# Patient Record
Sex: Male | Born: 1935 | Race: Black or African American | Hispanic: No | Smoking: Never smoker
Health system: Southern US, Community
[De-identification: ages and names within clinical notes are randomized; demographics above are authoritative.]

## PROBLEM LIST (undated history)

## (undated) DIAGNOSIS — F0390 Unspecified dementia without behavioral disturbance: Secondary | ICD-10-CM

## (undated) DIAGNOSIS — C229 Malignant neoplasm of liver, not specified as primary or secondary: Secondary | ICD-10-CM

## (undated) DIAGNOSIS — C801 Malignant (primary) neoplasm, unspecified: Secondary | ICD-10-CM

## (undated) DIAGNOSIS — B1920 Unspecified viral hepatitis C without hepatic coma: Secondary | ICD-10-CM

## (undated) HISTORY — PX: HIP SURGERY: SHX245

---

## 1999-06-30 ENCOUNTER — Ambulatory Visit (HOSPITAL_COMMUNITY): Admission: RE | Admit: 1999-06-30 | Discharge: 1999-06-30 | Payer: Self-pay | Admitting: Gastroenterology

## 1999-06-30 ENCOUNTER — Encounter (INDEPENDENT_AMBULATORY_CARE_PROVIDER_SITE_OTHER): Payer: Self-pay | Admitting: Specialist

## 2009-03-05 ENCOUNTER — Encounter: Admission: RE | Admit: 2009-03-05 | Discharge: 2009-03-05 | Payer: Self-pay | Admitting: Gastroenterology

## 2009-09-06 ENCOUNTER — Encounter: Admission: RE | Admit: 2009-09-06 | Discharge: 2009-09-06 | Payer: Self-pay | Admitting: Gastroenterology

## 2009-09-13 ENCOUNTER — Ambulatory Visit: Payer: Self-pay | Admitting: Hematology & Oncology

## 2009-10-07 LAB — CBC WITH DIFFERENTIAL (CANCER CENTER ONLY)
BASO#: 0 10*3/uL (ref 0.0–0.2)
Eosinophils Absolute: 0.3 10*3/uL (ref 0.0–0.5)
HCT: 38.7 % (ref 38.7–49.9)
LYMPH%: 49.1 % — ABNORMAL HIGH (ref 14.0–48.0)
MCH: 34.3 pg — ABNORMAL HIGH (ref 28.0–33.4)
MCV: 100 fL — ABNORMAL HIGH (ref 82–98)
MONO#: 0.4 10*3/uL (ref 0.1–0.9)
MONO%: 11.4 % (ref 0.0–13.0)
NEUT%: 31.2 % — ABNORMAL LOW (ref 40.0–80.0)
Platelets: 74 10*3/uL — ABNORMAL LOW (ref 145–400)
RBC: 3.86 10*6/uL — ABNORMAL LOW (ref 4.20–5.70)
WBC: 3.4 10*3/uL — ABNORMAL LOW (ref 4.0–10.0)

## 2009-10-07 LAB — PROTIME-INR (CHCC SATELLITE)

## 2009-10-09 LAB — COMPREHENSIVE METABOLIC PANEL
ALT: 61 U/L — ABNORMAL HIGH (ref 0–53)
Albumin: 3.3 g/dL — ABNORMAL LOW (ref 3.5–5.2)
CO2: 26 mEq/L (ref 19–32)
Glucose, Bld: 107 mg/dL — ABNORMAL HIGH (ref 70–99)
Potassium: 4.3 mEq/L (ref 3.5–5.3)
Sodium: 141 mEq/L (ref 135–145)
Total Protein: 6.8 g/dL (ref 6.0–8.3)

## 2009-10-09 LAB — LACTATE DEHYDROGENASE: LDH: 132 U/L (ref 94–250)

## 2009-10-09 LAB — AFP TUMOR MARKER: AFP-Tumor Marker: 799.7 ng/mL — ABNORMAL HIGH (ref 0.0–8.0)

## 2009-11-05 ENCOUNTER — Ambulatory Visit: Payer: Self-pay | Admitting: Hematology & Oncology

## 2009-11-06 LAB — COMPREHENSIVE METABOLIC PANEL
Alkaline Phosphatase: 91 U/L (ref 39–117)
BUN: 14 mg/dL (ref 6–23)
Creatinine, Ser: 1.12 mg/dL (ref 0.40–1.50)
Glucose, Bld: 82 mg/dL (ref 70–99)
Sodium: 139 mEq/L (ref 135–145)
Total Bilirubin: 1.5 mg/dL — ABNORMAL HIGH (ref 0.3–1.2)

## 2009-11-06 LAB — CBC WITH DIFFERENTIAL (CANCER CENTER ONLY)
BASO#: 0 10*3/uL (ref 0.0–0.2)
Eosinophils Absolute: 0.3 10*3/uL (ref 0.0–0.5)
HGB: 13.5 g/dL (ref 13.0–17.1)
MCH: 34.3 pg — ABNORMAL HIGH (ref 28.0–33.4)
MCV: 98 fL (ref 82–98)
MONO#: 0.4 10*3/uL (ref 0.1–0.9)
MONO%: 11.1 % (ref 0.0–13.0)
NEUT#: 1.2 10*3/uL — ABNORMAL LOW (ref 1.5–6.5)
Platelets: 84 10*3/uL — ABNORMAL LOW (ref 145–400)
RBC: 3.92 10*6/uL — ABNORMAL LOW (ref 4.20–5.70)
WBC: 3.7 10*3/uL — ABNORMAL LOW (ref 4.0–10.0)

## 2009-11-06 LAB — AFP TUMOR MARKER: AFP-Tumor Marker: 773.8 ng/mL — ABNORMAL HIGH (ref 0.0–8.0)

## 2010-03-07 ENCOUNTER — Ambulatory Visit: Payer: Self-pay | Admitting: Hematology & Oncology

## 2015-01-27 ENCOUNTER — Emergency Department (HOSPITAL_BASED_OUTPATIENT_CLINIC_OR_DEPARTMENT_OTHER): Payer: Medicare Other

## 2015-01-27 ENCOUNTER — Emergency Department (HOSPITAL_BASED_OUTPATIENT_CLINIC_OR_DEPARTMENT_OTHER)
Admission: EM | Admit: 2015-01-27 | Discharge: 2015-01-27 | Disposition: A | Discharge status: Home / Self Care | Payer: Medicare Other | Attending: Emergency Medicine | Admitting: Emergency Medicine

## 2015-01-27 ENCOUNTER — Encounter (HOSPITAL_BASED_OUTPATIENT_CLINIC_OR_DEPARTMENT_OTHER): Payer: Self-pay | Admitting: Emergency Medicine

## 2015-01-27 DIAGNOSIS — Y9389 Activity, other specified: Secondary | ICD-10-CM | POA: Diagnosis not present

## 2015-01-27 DIAGNOSIS — S8992XA Unspecified injury of left lower leg, initial encounter: Secondary | ICD-10-CM | POA: Insufficient documentation

## 2015-01-27 DIAGNOSIS — Y92129 Unspecified place in nursing home as the place of occurrence of the external cause: Secondary | ICD-10-CM | POA: Diagnosis not present

## 2015-01-27 DIAGNOSIS — Z79899 Other long term (current) drug therapy: Secondary | ICD-10-CM | POA: Insufficient documentation

## 2015-01-27 DIAGNOSIS — S3992XA Unspecified injury of lower back, initial encounter: Secondary | ICD-10-CM | POA: Insufficient documentation

## 2015-01-27 DIAGNOSIS — Y998 Other external cause status: Secondary | ICD-10-CM | POA: Diagnosis not present

## 2015-01-27 DIAGNOSIS — M25552 Pain in left hip: Secondary | ICD-10-CM

## 2015-01-27 DIAGNOSIS — Z7982 Long term (current) use of aspirin: Secondary | ICD-10-CM | POA: Diagnosis not present

## 2015-01-27 DIAGNOSIS — F0390 Unspecified dementia without behavioral disturbance: Secondary | ICD-10-CM | POA: Diagnosis not present

## 2015-01-27 DIAGNOSIS — W19XXXA Unspecified fall, initial encounter: Secondary | ICD-10-CM

## 2015-01-27 DIAGNOSIS — W1839XA Other fall on same level, initial encounter: Secondary | ICD-10-CM | POA: Diagnosis not present

## 2015-01-27 DIAGNOSIS — Z96642 Presence of left artificial hip joint: Secondary | ICD-10-CM | POA: Insufficient documentation

## 2015-01-27 DIAGNOSIS — S79912A Unspecified injury of left hip, initial encounter: Secondary | ICD-10-CM | POA: Diagnosis present

## 2015-01-27 DIAGNOSIS — M79605 Pain in left leg: Secondary | ICD-10-CM

## 2015-01-27 HISTORY — DX: Unspecified dementia, unspecified severity, without behavioral disturbance, psychotic disturbance, mood disturbance, and anxiety: F03.90

## 2015-01-27 MED ORDER — OXYCODONE-ACETAMINOPHEN 5-325 MG PO TABS
1.0000 | ORAL_TABLET | Freq: Once | ORAL | Status: DC
  Filled 2015-01-27: qty 1

## 2015-01-27 NOTE — ED Notes (Signed)
CBG 151. 

## 2015-01-27 NOTE — ED Provider Notes (Signed)
CSN: [id number]     Arrival date & time 01/27/15  1637 History  This chart was scribed for Wandra Arthurs, MD by Tula Nakayama, ED Scribe. This patient was seen in room MH10/MH10 and the patient's care was started at 4:51 PM.    Chief Complaint  Patient presents with  . Hip Pain  . Fall   The history is provided by the patient and a relative. No language interpreter was used.    HPI Comments: Jeffery Rhodes is a 81 y.o. male and resident of the [facility], with a history of dementia and left hip replacement, who presents to the Emergency Department complaining of constant hip pain that started after an unwitnessed fall 2 days ago. He states lower back pain as an associated symptom. Pt was found on his back in his room by staff around 8:30 pm on 2/5. He told his daughter that he got out of his bed to adjust the temperature and slipped on his socks. Pt hit his head, but did not suffer LOC. Pt has history of left hip replacement in December. He is currently in rehabilitation and does not walk without assistance. Pt's daughter denies taking anticoagulants.   Past Medical History  Diagnosis Date  . Dementia    Past Surgical History  Procedure Laterality Date  . Hip surgery Left    History reviewed. No pertinent family history. History  Substance Use Topics  . Smoking status: Never Smoker   . Smokeless tobacco: Not on file  . Alcohol Use: No    Review of Systems  Musculoskeletal: Positive for back pain and arthralgias.  Skin: Negative for wound.  Neurological: Negative for headaches.  All other systems reviewed and are negative.  Allergies  Review of patient's allergies indicates no known allergies.  Home Medications   Prior to Admission medications   Medication Sig Start Date End Date Taking? Authorizing Provider  amLODipine (NORVASC) 5 MG tablet Take 5 mg by mouth daily.   Yes Historical Provider, MD  aspirin 81 MG tablet Take 81 mg by mouth daily.   Yes Historical  Provider, MD  bisacodyl (DULCOLAX) 10 MG suppository Place 10 mg rectally daily as needed for moderate constipation.   Yes Historical Provider, MD  citalopram (CELEXA) 20 MG tablet Take 20 mg by mouth daily.   Yes Historical Provider, MD  docusate sodium (COLACE) 100 MG capsule Take 100 mg by mouth 2 (two) times daily.   Yes Historical Provider, MD  donepezil (ARICEPT) 10 MG tablet Take 10 mg by mouth at bedtime.   Yes Historical Provider, MD  furosemide (LASIX) 20 MG tablet Take 20 mg by mouth.   Yes Historical Provider, MD  magnesium hydroxide (MILK OF MAGNESIA) 400 MG/5ML suspension Take 30 mLs by mouth daily as needed for mild constipation.   Yes Historical Provider, MD  ondansetron (ZOFRAN) 4 MG tablet Take 4 mg by mouth every 8 (eight) hours as needed for nausea or vomiting.   Yes Historical Provider, MD  oxycodone (OXY-IR) 5 MG capsule Take 5 mg by mouth every 4 (four) hours as needed.   Yes Historical Provider, MD   BP 125/65 mmHg  Pulse 72  Temp(Src) 97.6 F (36.4 C) (Oral)  Resp 18  Ht 5' 9.5" (1.765 m)  Wt 140 lb (63.504 kg)  BMI 20.39 kg/m2  SpO2 100% Physical Exam  Constitutional: He appears well-developed and well-nourished. No distress.  HENT:  Head: Normocephalic and atraumatic.  No scalp hematoma; mucous membranes moist  Eyes: Conjunctivae and EOM are normal.  Neck: Neck supple. No tracheal deviation present.  Cardiovascular: Normal rate, regular rhythm and normal heart sounds.   Pulmonary/Chest: Effort normal. No respiratory distress.  Musculoskeletal:  Decreased ROM of left hip; mild tenderness to left hip and left leg; lower lumbar tenderness  Skin: Skin is warm and dry.  Psychiatric: He has a normal mood and affect. His behavior is normal.  Nursing note and vitals reviewed.   ED Course  Procedures (including critical care time) DIAGNOSTIC STUDIES: Oxygen Saturation is 100% on RA, normal by my interpretation.    COORDINATION OF CARE: 4:58 PM Discussed  treatment plan with pt's daughter at bedside and she agreed to plan.  Labs Review Labs Reviewed - No data to display  Imaging Review Dg Lumbar Spine Complete  01/27/2015   CLINICAL DATA:  Initial encounter for Fall with left hip and low back pain. Left hip replacement in December.  EXAM: LUMBAR SPINE - COMPLETE 4+ VIEW  COMPARISON:  CT of 11/21/2014  FINDINGS: Five lumbar type vertebral bodies. Left proximal femoral fixation, incompletely imaged. Maintenance of vertebral body height. Mild straightening of expected lordosis. Degenerative disc disease at the L4-5 level is advanced. Aortic atherosclerosis. Facet arthropathy is most advanced at the lumbosacral junction.  IMPRESSION: Degenerative disc disease and spondylosis, without acute finding.  Nonspecific straightening of expected lordosis.   Electronically Signed   By: Abigail Miyamoto M.D.   On: 01/27/2015 17:58   Dg Tibia/fibula Left  01/27/2015   CLINICAL DATA:  Fall 3 days ago.  Leg pain  EXAM: LEFT TIBIA AND FIBULA - 2 VIEW  COMPARISON:  None.  FINDINGS: Negative for acute fracture.  No focal bony lesion.  Mild arterial calcification.  IMPRESSION: Negative for fracture.   Electronically Signed   By: Franchot Gallo M.D.   On: 01/27/2015 18:00   Dg Hip Unilat With Pelvis 2-3 Views Left  01/27/2015   CLINICAL DATA:  [place] 3 days ago.  Left hip surgery 2 months ago.  EXAM: LEFT HIP (WITH PELVIS) 2-3 VIEWS  COMPARISON:  None.  FINDINGS: Three pins across the left femoral neck into the femoral head in good position. Nondisplaced left femoral neck fracture in satisfactory position and unchanged from preop studies.  No acute fracture or complication.  IMPRESSION: Satisfactory pinning of left femoral neck fracture. No new fracture or complication.   Electronically Signed   By: Franchot Gallo M.D.   On: 01/27/2015 17:59   Dg Femur Min 2 Views Left  01/27/2015   CLINICAL DATA:  Left hip pain and low back pain secondary to a fall yesterday. Previous left hip  fracture.  EXAM: LEFT FEMUR 2 VIEWS  COMPARISON:  Radiographs dated 11/22/2014 and 11/21/2014  FINDINGS: There is no acute abnormality of the femur. The patient has undergone open reduction internal fixation of subcapital fracture of the proximal left femoral neck. The fracture is slightly impacted. There are 3 screws in place.  IMPRESSION: No acute abnormality. Old fracture (11/21/2014) of the left femoral neck.   Electronically Signed   By: Rozetta Nunnery M.D.   On: 01/27/2015 18:01     EKG Interpretation None      MDM   Final diagnoses:  Hip pain, acute, left  Leg pain, anterior, left    Jeffery Rhodes is a 81 y.o. male here with l leg pain s/p fall. He did have head injury 2 days ago but mental status at baseline and no signs of injury so  will not need CT head. He has diffuse L lower extremity tenderness with no obvious deformity. Xray of back and L lower extremity showed no acute fracture and the pin L hip is in right location. He is written for oxycodone at the facility. Can d/c back   I personally performed the services described in this documentation, which was scribed in my presence. The recorded information has been reviewed and is accurate.    Wandra Arthurs, MD 01/27/15 [phone]

## 2015-01-27 NOTE — Discharge Instructions (Signed)
You have no fractures of your hip.   Continue rehab.   Take oxycodone as prescribed for pain.   Follow up with your doctor.   Return to ER if he has severe pain, vomiting, headaches.

## 2015-01-27 NOTE — ED Notes (Signed)
Pt had hip replacement in December.  Had a fall on Friday, does not complain of any pain.  Pt's granddaughter insisted that he come to be seen for hip evaluation.  Pt h/o dementia.

## 2015-01-27 NOTE — ED Notes (Signed)
PTAR here. Pt al;ert, NAD, calm, interactive, resps e/u, family at [facility]. Denies needs or questions. D/c'd by previous Therapist, sports. Family with paperwork.

## 2015-06-12 ENCOUNTER — Encounter (HOSPITAL_BASED_OUTPATIENT_CLINIC_OR_DEPARTMENT_OTHER): Payer: Self-pay | Admitting: *Deleted

## 2015-06-12 ENCOUNTER — Observation Stay (HOSPITAL_BASED_OUTPATIENT_CLINIC_OR_DEPARTMENT_OTHER)
Admission: EM | Admit: 2015-06-12 | Discharge: 2015-06-13 | Disposition: A | Discharge status: Skilled Nursing Facility | Payer: Medicare Other | Attending: Internal Medicine | Admitting: Internal Medicine

## 2015-06-12 ENCOUNTER — Emergency Department (HOSPITAL_BASED_OUTPATIENT_CLINIC_OR_DEPARTMENT_OTHER): Payer: Medicare Other

## 2015-06-12 DIAGNOSIS — R5383 Other fatigue: Secondary | ICD-10-CM | POA: Diagnosis not present

## 2015-06-12 DIAGNOSIS — I10 Essential (primary) hypertension: Secondary | ICD-10-CM | POA: Diagnosis not present

## 2015-06-12 DIAGNOSIS — B1920 Unspecified viral hepatitis C without hepatic coma: Secondary | ICD-10-CM | POA: Diagnosis not present

## 2015-06-12 DIAGNOSIS — F0390 Unspecified dementia without behavioral disturbance: Secondary | ICD-10-CM | POA: Diagnosis not present

## 2015-06-12 DIAGNOSIS — R531 Weakness: Secondary | ICD-10-CM | POA: Diagnosis not present

## 2015-06-12 DIAGNOSIS — R627 Adult failure to thrive: Principal | ICD-10-CM | POA: Diagnosis present

## 2015-06-12 DIAGNOSIS — E119 Type 2 diabetes mellitus without complications: Secondary | ICD-10-CM | POA: Insufficient documentation

## 2015-06-12 DIAGNOSIS — M1389 Other specified arthritis, multiple sites: Secondary | ICD-10-CM | POA: Insufficient documentation

## 2015-06-12 DIAGNOSIS — M79601 Pain in right arm: Secondary | ICD-10-CM | POA: Diagnosis not present

## 2015-06-12 DIAGNOSIS — Z515 Encounter for palliative care: Secondary | ICD-10-CM | POA: Diagnosis not present

## 2015-06-12 DIAGNOSIS — C220 Liver cell carcinoma: Secondary | ICD-10-CM | POA: Diagnosis present

## 2015-06-12 DIAGNOSIS — Z7982 Long term (current) use of aspirin: Secondary | ICD-10-CM | POA: Diagnosis not present

## 2015-06-12 HISTORY — DX: Malignant (primary) neoplasm, unspecified: C80.1

## 2015-06-12 HISTORY — DX: Unspecified viral hepatitis C without hepatic coma: B19.20

## 2015-06-12 HISTORY — DX: Malignant neoplasm of liver, not specified as primary or secondary: C22.9

## 2015-06-12 LAB — COMPREHENSIVE METABOLIC PANEL
ALT: 49 U/L (ref 17–63)
ANION GAP: 8 (ref 5–15)
AST: 72 U/L — ABNORMAL HIGH (ref 15–41)
Albumin: 2.4 g/dL — ABNORMAL LOW (ref 3.5–5.0)
Alkaline Phosphatase: 58 U/L (ref 38–126)
BUN: 15 mg/dL (ref 6–20)
CALCIUM: 8.8 mg/dL — AB (ref 8.9–10.3)
CO2: 24 mmol/L (ref 22–32)
Chloride: 103 mmol/L (ref 101–111)
Creatinine, Ser: 0.69 mg/dL (ref 0.61–1.24)
GLUCOSE: 121 mg/dL — AB (ref 65–99)
Potassium: 3.9 mmol/L (ref 3.5–5.1)
Sodium: 135 mmol/L (ref 135–145)
Total Bilirubin: 1 mg/dL (ref 0.3–1.2)
Total Protein: 7.7 g/dL (ref 6.5–8.1)

## 2015-06-12 LAB — CBC
HEMATOCRIT: 36.3 % — AB (ref 39.0–52.0)
HEMOGLOBIN: 12.5 g/dL — AB (ref 13.0–17.0)
MCH: 32.6 pg (ref 26.0–34.0)
MCHC: 34.4 g/dL (ref 30.0–36.0)
MCV: 94.5 fL (ref 78.0–100.0)
Platelets: 317 10*3/uL (ref 150–400)
RBC: 3.84 MIL/uL — AB (ref 4.22–5.81)
RDW: 11.6 % (ref 11.5–15.5)
WBC: 9.4 10*3/uL (ref 4.0–10.5)

## 2015-06-12 LAB — URINALYSIS, ROUTINE W REFLEX MICROSCOPIC
BILIRUBIN URINE: NEGATIVE
Glucose, UA: NEGATIVE mg/dL
Hgb urine dipstick: NEGATIVE
Ketones, ur: 15 mg/dL — AB
Leukocytes, UA: NEGATIVE
NITRITE: NEGATIVE
PH: 7 (ref 5.0–8.0)
PROTEIN: NEGATIVE mg/dL
Specific Gravity, Urine: 1.018 (ref 1.005–1.030)
UROBILINOGEN UA: 4 mg/dL — AB (ref 0.0–1.0)

## 2015-06-12 LAB — I-STAT CG4 LACTIC ACID, ED: Lactic Acid, Venous: 1.01 mmol/L (ref 0.5–2.0)

## 2015-06-12 MED ORDER — ACETAMINOPHEN 650 MG RE SUPP: 650.0000 mg | Freq: Four times a day (QID) | RECTAL | Status: DC | PRN

## 2015-06-12 MED ORDER — ONDANSETRON HCL 4 MG PO TABS: 4.0000 mg | ORAL_TABLET | Freq: Four times a day (QID) | ORAL | Status: DC | PRN

## 2015-06-12 MED ORDER — SODIUM CHLORIDE 0.9 % IV SOLN
INTRAVENOUS | Status: DC
  Administered 2015-06-13: 06:00:00 via INTRAVENOUS

## 2015-06-12 MED ORDER — ENOXAPARIN SODIUM 40 MG/0.4ML ~~LOC~~ SOLN
40.0000 mg | SUBCUTANEOUS | Status: DC
  Administered 2015-06-13: 40 mg via SUBCUTANEOUS
  Filled 2015-06-12: qty 0.4

## 2015-06-12 MED ORDER — ONDANSETRON HCL 4 MG/2ML IJ SOLN: 4.0000 mg | Freq: Four times a day (QID) | INTRAMUSCULAR | Status: DC | PRN

## 2015-06-12 MED ORDER — ACETAMINOPHEN 325 MG PO TABS: 650.0000 mg | ORAL_TABLET | Freq: Four times a day (QID) | ORAL | Status: DC | PRN

## 2015-06-12 MED ORDER — SODIUM CHLORIDE 0.9 % IV SOLN
Freq: Once | INTRAVENOUS | Status: AC
  Administered 2015-06-12: 18:00:00 via INTRAVENOUS

## 2015-06-12 NOTE — ED Notes (Signed)
Pt has a stage 2 decub to sacral area, reddened, meplex applied. PT repositioned.

## 2015-06-12 NOTE — Progress Notes (Signed)
Pt accepted to [facility]. Pt daughter making arrangments to sign patient in to facility. Once paperwork has been completed pt can be transferred. CSW to call RN with time and arrangements.   Belia Heman, [place] Work  [employer] [phone]

## 2015-06-12 NOTE — ED Notes (Signed)
Pt's granddaughter at bedside is caregiver and sts she has [facility] POA. She sts that she is no longer able to care for the pt due to his body being stiff and him no longer being able to stand, so she had EMS bring him here so he could be placed into hospice care. Pt had appointment today with PCP for f/u. Pt able to tell me his name but not oriented otherwise. While adjusting pt's pillow, he appeared to be in pain but could not localize pain for me.

## 2015-06-12 NOTE — ED Notes (Signed)
Prior to in/out cath, complete perineal cleaning provided with allevyn gel dressing placed on sacral area due to possible skin breakdown. Brief applied.

## 2015-06-12 NOTE — ED Notes (Signed)
MD at bedside. 

## 2015-06-12 NOTE — ED Notes (Signed)
Spoke with Education officer, museum by phone who sts that she will contact pt's grand daughter and attempt to place home health for the time being. She will call me back with details/plan later.
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
## 2015-06-12 NOTE — ED Notes (Signed)
EMS reports called out for body aches and joint pain. Pt's care giver told EMS that he is in pain and that causes his body to be stiff and rigid and that she cannot take care of him when he is like that. EMS sts pt denied pain with them and is CAO at baseline and pleasant.

## 2015-06-12 NOTE — ED Notes (Signed)
Pt's grand daughter is leaving. Will call her with room number once room has been assigned.

## 2015-06-12 NOTE — ED Notes (Signed)
Updated [name], Social Work, that patients granddaughter, Jeffery Rhodes, would not answer or call back.

## 2015-06-12 NOTE — Progress Notes (Addendum)
[facility] called phone number listed for patient's POA Ms. Shirlee Limerick, patient's POA [phone] and spoke Lanell Persons mother.  EDCM left phone number with Twanetta's mother to call [facility] back ASAP.  EDSW gave [facility] another phone number to reach [facility] [phone].  EDCM called this phone number and left generic message with phone number to call back ASAP.    06/12/2015 A.Constance Whittle RNCM 1740pm Attempted to call patient's POA Twanetta again without success.  Left another message with phone number for call back.

## 2015-06-12 NOTE — ED Notes (Signed)
I spoke with Belia Heman, Social Worker with [facility], she emailed a placement form which I printed out and Dr. Mingo Amber signed. Same is placed on pt's chart. She will call back once placement has been arranged.

## 2015-06-12 NOTE — ED Notes (Signed)
Dr Tamera Punt concerned about disposition of patient - Called Social Worker @ [phone] Message left for [name].

## 2015-06-12 NOTE — ED Notes (Signed)
Spoke to [employer], [place] Work, who states that granddaughter is in the process of sending her the needed documentation to move forward with placement, so that they can arrange non emergent transport for patient. Agricultural consultant and EDP updated.

## 2015-06-12 NOTE — ED Notes (Signed)
Attempted to call granddaughter, Melina Fiddler @ [phone] and [phone] - to see if she had an update from Social Work or if she had completed needed documents for transfer - no answer on both lines - message left.
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
## 2015-06-12 NOTE — Progress Notes (Signed)
Demographics  Comment      Last edited by  on at    Address: Home Phone: Work Futures trader:   [address]  [place] [address]   [phone] -- [phone]   SSN: Insurance: Marital Status: Religion:   [id number] [place] [employer]    Patient Information    Patient Name Sex DOB   Gehrig, Patras ([id number]) Male April 14, 1929     Room Bed   [place]    PCS Documentation       Most Recent Value   Medical Necessity for Transport Certificate --- IF THIS TRANSPORT IS ROUND TRIP OR SCHEDULED AND REPEATED, A PHYSICIAN MUST COMPLETE THIS FORM    Transport to (Location)  -- [[facility]]   Reason for Transport  Discharge   Name of [facility]   Round Trip Transport?  No   Q1 Are ALL the following "true" for this patient?       1. Unable to get up from bed without assistance AND      2. Unable to ambulate AND    3. Unable to sit in a chair, including a wheelchair.  Yes   Q2 Could the patient be transported safely by other means of transportation (I.E., wheelchair van)?  No   Reason for transport - patient condition  Risk of injury to self and/or others   Q3 Reason based on Facility and/or Services  Transfer due to patient or family Physiological scientist (can be: Physician, RN, NP, PA, DP, CNS)  Belia Heman, [place] Phone: [phone]   Subscriber: [place], [place] Subscriber#: [id number]   Group#: [id number] Precert#:       MEDICAID [place]/MEDICAID OF [place] Phone: [phone]   Subscriber: Kru, [place] Subscriber#: [id number] N   Group#:  Precert#:

## 2015-06-12 NOTE — Progress Notes (Signed)
CSW received consult from EDP and consulted hosptialist regarding patient needing resources including possible placement, home health, or hospice referral. CSW attempted to reach pt granddaughter, twanetta Shirlee Limerick, who is primary care giver and HCPOA for patient per nurse. CSW called pt granddaughter at [phone] and left message.   Belia Heman, [place] Work  [employer] [phone]
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
## 2015-06-12 NOTE — ED Notes (Signed)
Assumed care of patient from [place], Therapist, sports, pt awaiting social worker Belia Heman) placement to SNF. Pt repositioned, oral care given, pericare provided, incontinence care given, pt repositioned on side with assist of pillow. VSS. Pt resting quietly. Sleeps but easily aroused with verbal stimuli - baseline confusion present. No s/s of pain noted. Will monitor.
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
## 2015-06-12 NOTE — ED Notes (Signed)
Left message with On call Social Worker @ [phone] - awaiting return phone call

## 2015-06-12 NOTE — ED Provider Notes (Signed)
PT was placed in a SNF, but granddaughter refused to sign permission.  Wants hospice consult.  When explained to her that we cannot place in hospice home today, granddaughter was told she needed to come pick up her grandfather, but refused.  SW and RN here have tried multiple times to reach granddaughter, but she will now not answer the phone.  Pt has been here at [facility] for 12 hours.  Spoke with Dr. Ree Kida, hospitalist at [facility] who has agreed to admit the pt for obs.  Malvin Johns, MD 06/12/15 [phone]

## 2015-06-12 NOTE — Discharge Instructions (Signed)

## 2015-06-12 NOTE — ED Notes (Signed)
Paged multiple social work pagers in an attempt to figure out disposition plan for patient:  Paged [phone]; [phone]; [phone]

## 2015-06-12 NOTE — ED Notes (Addendum)
Jeffery Rhodes, now states she wants Hospice involved in the patients care. Notified [name] with Social Work.

## 2015-06-13 ENCOUNTER — Observation Stay (HOSPITAL_COMMUNITY): Payer: Medicare Other

## 2015-06-13 DIAGNOSIS — Z515 Encounter for palliative care: Secondary | ICD-10-CM

## 2015-06-13 DIAGNOSIS — R627 Adult failure to thrive: Secondary | ICD-10-CM | POA: Diagnosis not present

## 2015-06-13 DIAGNOSIS — M79601 Pain in right arm: Secondary | ICD-10-CM | POA: Diagnosis present

## 2015-06-13 DIAGNOSIS — C220 Liver cell carcinoma: Secondary | ICD-10-CM | POA: Diagnosis present

## 2015-06-13 LAB — COMPREHENSIVE METABOLIC PANEL
ALT: 52 U/L (ref 17–63)
ANION GAP: 8 (ref 5–15)
AST: 88 U/L — ABNORMAL HIGH (ref 15–41)
Albumin: 2.2 g/dL — ABNORMAL LOW (ref 3.5–5.0)
Alkaline Phosphatase: 61 U/L (ref 38–126)
BILIRUBIN TOTAL: 0.6 mg/dL (ref 0.3–1.2)
BUN: 12 mg/dL (ref 6–20)
CHLORIDE: 106 mmol/L (ref 101–111)
CO2: 25 mmol/L (ref 22–32)
Calcium: 8.9 mg/dL (ref 8.9–10.3)
Creatinine, Ser: 0.6 mg/dL — ABNORMAL LOW (ref 0.61–1.24)
GFR calc non Af Amer: >60 mL/min (ref >60)
GLUCOSE: 83 mg/dL (ref 65–99)
POTASSIUM: 4 mmol/L (ref 3.5–5.1)
SODIUM: 139 mmol/L (ref 135–145)
Total Protein: 6.9 g/dL (ref 6.5–8.1)

## 2015-06-13 LAB — PROTIME-INR
INR: 1.19 (ref 0.00–1.49)
PROTHROMBIN TIME: 15.3 s — AB (ref 11.6–15.2)

## 2015-06-13 LAB — CBC
HCT: 37.1 % — ABNORMAL LOW (ref 39.0–52.0)
Hemoglobin: 12.6 g/dL — ABNORMAL LOW (ref 13.0–17.0)
MCH: 32.3 pg (ref 26.0–34.0)
MCHC: 34 g/dL (ref 30.0–36.0)
MCV: 95.1 fL (ref 78.0–100.0)
Platelets: 322 10*3/uL (ref 150–400)
RBC: 3.9 MIL/uL — ABNORMAL LOW (ref 4.22–5.81)
RDW: 12.6 % (ref 11.5–15.5)
WBC: 8 10*3/uL (ref 4.0–10.5)

## 2015-06-13 MED ORDER — ONDANSETRON HCL 4 MG PO TABS: 4.0000 mg | ORAL_TABLET | Freq: Four times a day (QID) | ORAL | Status: DC | PRN

## 2015-06-13 NOTE — Progress Notes (Signed)
SLP Cancellation Note  Patient Details Name: Jeffery Rhodes MRN: [id number] DOB: Aug 22, 1929   Cancelled treatment:       Reason Eval/Treat Not Completed: Patient at procedure or test/unavailable   Germain Osgood, M.A. CCC-SLP [phone]  Germain Osgood 06/13/2015, 9:19 AM
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
## 2015-06-13 NOTE — Discharge Planning (Addendum)
Pt's grandaughter called, at 0930  very upset regarding his care and discharge planning. Allowed to ventilate and then given Social work department number for her to f/u with. I had not seen the note from [place] at that time.  At 1100 Case manger states patient will transfer to [facility] today.
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
## 2015-06-13 NOTE — Evaluation (Signed)
Clinical/Bedside Swallow Evaluation Patient Details  Name: Jeffery Rhodes MRN: [id number] Date of Birth: 1929/04/02  Today's Date: 06/13/2015 Time: SLP Start Time (ACUTE ONLY): 1015 SLP Stop Time (ACUTE ONLY): 1028 SLP Time Calculation (min) (ACUTE ONLY): 13 min  Past Medical History:  Past Medical History  Diagnosis Date  . Dementia   . Cancer   . Liver cancer   . Hepatitis C    Past Surgical History:  Past Surgical History  Procedure Laterality Date  . Hip surgery Left    HPI:  CLAUDIA ALVIZO is a 81 y.o. male who presents with increasing fatigue/FTT. PMH: hepatocellular carcinoma, hepatitis C, dementia, hypertension.   Assessment / Plan / Recommendation Clinical Impression  Pt does not have overt signs of aspiration or dysphagia at this time. He shares that he does prefer softer foods due to missing upper dentition. Will initiate Dys 3 diet and thin liquids. No acute SLP needs identified at this time.    Aspiration Risk  Mild    Diet Recommendation Dysphagia 3 (Mech soft);Thin   Medication Administration: Whole meds with puree Compensations: Slow rate;Small sips/bites    Other  Recommendations Oral Care Recommendations: Oral care BID   Pertinent Vitals/Pain n/a    SLP Swallow Goals     Swallow Study Prior Functional Status       General Other Pertinent Information: [name] is a 81 y.o. male who presents with increasing fatigue/FTT. PMH: hepatocellular carcinoma, hepatitis C, dementia, hypertension. Type of Study: Bedside swallow evaluation Previous Swallow Assessment: none in chart Diet Prior to this Study: NPO Temperature Spikes Noted: No Respiratory Status: Room air History of Recent Intubation: No Behavior/Cognition: Alert;Cooperative;Pleasant mood;Requires cueing Oral Cavity - Dentition: Missing dentition;Other (Comment) (natural lower dentition, edentulous upper) Self-Feeding Abilities: Needs assist Patient Positioning: Upright in bed Baseline  Vocal Quality: Low vocal intensity    Oral/Motor/Sensory Function Overall Oral Motor/Sensory Function: Appears within functional limits for tasks assessed   Ice Chips Ice chips: Not tested   Thin Liquid Thin Liquid: Within functional limits Presentation: Self Fed;Straw    Nectar Thick Nectar Thick Liquid: Not tested   Honey Thick Honey Thick Liquid: Not tested   Puree Puree: Within functional limits Presentation: Spoon;Self Fed   Solid   GO Functional Assessment Tool Used: skilled clinical judgment Functional Limitations: Swallowing Swallow Current Status [phone]): At least 1 percent but less than 20 percent impaired, limited or restricted Swallow Goal Status [phone]): At least 1 percent but less than 20 percent impaired, limited or restricted Swallow Discharge Status [phone]): At least 1 percent but less than 20 percent impaired, limited or restricted  Solid: Within functional limits        Germain Osgood, M.A. CCC-SLP [phone]  Germain Osgood 06/13/2015,11:31 AM

## 2015-06-13 NOTE — Consult Note (Signed)
Consultation Note Date: 06/13/2015   Patient Name: Jeffery Rhodes  DOB: May 30, 1929  MRN: [id number]  Age / Sex: 81 y.o., male   PCP: Omer Jack, MD Referring Physician: Reyne Dumas, MD  Reason for Consultation: Establishing goals of care and Inpatient hospice referral  Palliative Care Assessment and Plan Summary of Established Goals of Care and Medical Treatment Preferences    Palliative Care Discussion Held Today:   Mr. Beever is sitting up in bed and working with SLP when I come in. He was doing very well with his swallowing and was very happy to have some water. He is a very pleasant gentleman and was able to tell me his name and that he is in the hospital. His speech is delayed but he does answer most questions appropriately. He tells me he has no pain but does occasionally have generalized pain - he cannot describe further. 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 Chaplain was also at bedside and provided reassurance and comfort. He could not identify anything he was specifically worried about. He appeared to be getting more tired and speech began to become slower and more delayed.   At this time he does not appear to be appropriate for hospice facility and would be better served at [facility] for his care. If he in the future gets Medicaid to supplement his Medicare he may possibly receive hospice at [facility]. Unable to reach HCPOA to further discuss: code status, goals, rehospitalization, disposition. I did speak with DSS worker Lucila Maine [phone] and she confirms open case for pursuing guardianship but this is not official and decision maker is now still Neysa Bonito.    Contacts/Participants in Discussion: Primary Decision Maker:  Granddaughter - Neysa Bonito is HCPOA (copy of form is scanned in EPIC) but did not answer call or call back as of yet  Goals of Care/Code Status/Advance Care Planning:   Code Status: Full - unable to assess   Symptom Management:   Gen Pain: Continue home dose oxy IR prn.   Malnutrition as evidenced by muscle wasting, albumin 2.2. Recommend nutritional eval and supplements.   Psycho-social/Spiritual:   Support System: Unable to fully assess - appears to be poor.   Desire for further Chaplaincy support: yes  Prognosis: Unable to determine  Discharge Planning:  SNF with palliative       Chief Complaint: Fatigue  History of Present Illness: 81 y.o. male with Past medical history of hepatocellular carcinoma, hepatitis C, dementia, hypertension. The patient was brought in to [facility] [place] earlier with complaints of fatigue and with request for placement by granddaughter, as the family was unable to take care of the patient. Social worker was consulted, and [facility] living home was arranged for skilled nursing facility, the family did not want the patient to go to that facility and requested the patient to receive care at the hospice facility (I do not believe he is a candidate for hospice facility currently.) Since the hospice was not available when this discussion was held, the granddaughter was requested to take the patient home after discharge as there was no acute inpatient care require as per ED physician at [facility], but family was not available to  receive the patient.   Primary Diagnoses  Present on Admission:  . Failure to thrive in adult . Hepatocellular carcinoma . Right arm pain  Palliative Review of Systems:   Denies pain   I have reviewed the medical record, interviewed the patient and family, and examined the patient. The following aspects are pertinent.  Past Medical History  Diagnosis Date  . Dementia   . Cancer   . Liver cancer   . Hepatitis C     History   Social History  . Marital Status: Divorced    Spouse Name: N/A  . Number of Children: N/A  . Years of Education: N/A   Social History Main Topics  . Smoking status: Never Smoker   . Smokeless tobacco: Not on file  . Alcohol Use: No  . Drug Use: Not on file  . Sexual Activity: Not on file   Other Topics Concern  . None   Social History Narrative   No family history on file. Scheduled Meds: . enoxaparin (LOVENOX) injection  40 mg Subcutaneous Q24H   Continuous Infusions: . sodium chloride 75 mL/hr at 06/13/15 0548   PRN Meds:.acetaminophen **OR** acetaminophen, ondansetron **OR** ondansetron (ZOFRAN) IV Medications Prior to Admission:  Prior to Admission medications   Medication Sig Start Date End Date Taking? Authorizing Provider  amLODipine (NORVASC) 5 MG tablet Take 5 mg by mouth daily.   Yes Historical Provider, MD  furosemide (LASIX) 20 MG tablet Take 20 mg by mouth.    Historical Provider, MD  oxycodone (OXY-IR) 5 MG capsule Take 5 mg by mouth every 4 (four) hours as needed.    Historical Provider, MD   No Known Allergies CBC:    Component Value Date/Time   WBC 8.0 06/13/2015 0323   WBC 3.7* 11/06/2009 1059   HGB 12.6* 06/13/2015 0323   HGB 13.5 11/06/2009 1059   HCT 37.1* 06/13/2015 0323   HCT 38.3* 11/06/2009 1059   PLT 322 06/13/2015 0323   PLT 84* 11/06/2009 1059   MCV 95.1 06/13/2015 0323   MCV 98 11/06/2009 1059   NEUTROABS 1.2* 11/06/2009 1059   LYMPHSABS 1.8 11/06/2009 1059   EOSABS 0.3 11/06/2009 1059   BASOSABS 0.0 11/06/2009 1059   Comprehensive Metabolic Panel:    Component Value Date/Time   NA 139 06/13/2015 0323   K 4.0 06/13/2015 0323   CL 106 06/13/2015 0323   CO2 25 06/13/2015 0323   BUN 12 06/13/2015 0323   CREATININE 0.60* 06/13/2015 0323   GLUCOSE 83 06/13/2015 0323   CALCIUM 8.9 06/13/2015 0323   AST 88* 06/13/2015 0323   ALT 52 06/13/2015 0323   ALKPHOS 61 06/13/2015 0323   BILITOT 0.6 06/13/2015 0323   PROT  6.9 06/13/2015 0323   ALBUMIN 2.2* 06/13/2015 0323    Physical Exam:  Vital Signs: BP 143/62 mmHg  Pulse 65  Temp(Src) 97.4 F (36.3 C) (Axillary)  Resp 18  Ht 5\' 9"  (1.753 m)  Wt 62.234 kg (137 lb 3.2 oz)  BMI 20.25 kg/m2  SpO2 100% SpO2: SpO2: 100 % O2 Device: O2 Device: Not Delivered O2 Flow Rate:   Intake/output summary:  Intake/Output Summary (Last 24 hours) at 06/13/15 1023 Last data filed at 06/13/15 0607  Gross per 24 hour  Intake      0 ml  Output    300 ml  Net   -300 ml   Baseline Weight: Weight: 68.04 kg (150 lb) Most recent weight: Weight: 62.234 kg (137 lb 3.2 oz)  Exam  Findings:   General: NAD, sitting up, fatigued appearing HEENT: Temporal muscle wasting CVS: RRR Resp: No labored breathing Abd: Soft, NT, ND Extrem: Warm, dry Neuro: Sleepy, oriented x 2           Palliative Performance Scale: 30 %                Additional Data Reviewed: Recent Labs     06/12/15  0820  06/13/15  0323  WBC  9.4  8.0  HGB  12.5*  12.6*  PLT  317  322  NA  135  139  BUN  15  12  CREATININE  0.69  0.60*     Time In: 1020 Time Out: 1115 Time Total: 53min  Greater than 50%  of this time was spent counseling and coordinating care related to the above assessment and plan.   Signed by:  Vinie Sill, NP Palliative Medicine Team Pager # [phone] (M-F 8a-5p) Team Phone # [phone] (Nights/Weekends)
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
## 2015-06-13 NOTE — Progress Notes (Signed)
Pt was referred by nurse for Palliative care.Chaplain joined palliative team for consultation. Pt is weak and spoke of being a believer. Chaplain told him he wants to hear  his story as he is in considerable distress. Chaplain prayed with Pt and will continue to follow.   06/13/15 1000  Clinical Encounter Type  Visited With Patient  Visit Type Spiritual support;Critical Care  Referral From Nurse  Spiritual Encounters  Spiritual Needs Prayer;Emotional  Stress Factors  Patient Stress Factors Major life changes

## 2015-06-13 NOTE — Clinical Social Work Note (Addendum)
CSW contacted APS worker Leone Haven [phone] to inform her that patient will possibly discharge to [facility] will be signing patient in.  CSW spoke with [facility] who agreed to fax paperwork over to social work office so it can be completed.  CSW notified physician that patient has a bed today at [facility] signed and awaiting discharge summary and orders.  CSW spoke to [place] worker and gave her the contact information for [facility] on [address]  Paperwork has been completed for SNF admission and faxed to facility.  Jones Broom. [place], MSW, [place] 06/13/2015 11:43 AM

## 2015-06-13 NOTE — Clinical Social Work Note (Addendum)
CSW spoke to APS worker Leone Haven [phone] to discuss status of guardianship case.  Jeani Hawking reported that the case is in the process of being petitioned, and a court date has not been determined yet.  CSW informed Clinical Social Librarian, academic Rife that [place] worker reported it most likely will not be ready until Monday the 27th of June and Leone Haven will notify CSW and/or Contractor when the court date has been determined.  DSS worker stated it is currently an open APS case.  Patient to be d/c'ed today to [facility], patient to be transported via ems RN to call report.  Jones Broom. [place], MSW, [place] 06/13/2015 2:27 PM

## 2015-06-14 NOTE — Clinical Social Work Note (Signed)
CSW received phone call from Jeffery Rhodes from [place] who informed me that her office number is [phone].  This phone number was given to [facility] admissions worker as well.  Jeffery Rhodes also asked CSW where patient was previous to [facility] [place] informed her that he was at [facility], CSW gave contact information for facility.  CSW informed APS worker that patient will be discharging to Jeffery Rhodes, Jeffery Rhodes asked Jeffery Rhodes if she will inform daughter about transfer and that [place] signed her in and she said yes she would contact them.    Evette Cristal, MSW, [place], [phone]

## 2015-06-14 NOTE — Discharge Planning (Signed)
Discharged per EMS at 1600 with the clothing in the room.

## 2015-06-14 NOTE — Clinical Social Work Note (Signed)
CSW attempted to contact APS worker Leone Haven at her office number [phone].  CSW left a message on her voice mail, awaiting call back.  Jones Broom. [place], MSW, [place] 06/14/2015 11:03 AM

## 2015-06-17 ENCOUNTER — Non-Acute Institutional Stay (SKILLED_NURSING_FACILITY): Payer: Medicare Other | Admitting: Adult Health

## 2015-06-17 DIAGNOSIS — R627 Adult failure to thrive: Secondary | ICD-10-CM

## 2015-06-17 DIAGNOSIS — C220 Liver cell carcinoma: Secondary | ICD-10-CM

## 2015-06-17 DIAGNOSIS — B182 Chronic viral hepatitis C: Secondary | ICD-10-CM | POA: Diagnosis not present

## 2015-06-17 DIAGNOSIS — F0390 Unspecified dementia without behavioral disturbance: Secondary | ICD-10-CM

## 2015-06-18 ENCOUNTER — Encounter: Payer: Self-pay | Admitting: Internal Medicine

## 2015-06-18 ENCOUNTER — Non-Acute Institutional Stay (SKILLED_NURSING_FACILITY): Payer: Medicare Other | Admitting: Internal Medicine

## 2015-06-18 ENCOUNTER — Encounter: Payer: Self-pay | Admitting: Adult Health

## 2015-06-18 DIAGNOSIS — F0390 Unspecified dementia without behavioral disturbance: Secondary | ICD-10-CM

## 2015-06-18 DIAGNOSIS — R531 Weakness: Secondary | ICD-10-CM

## 2015-06-18 DIAGNOSIS — B1920 Unspecified viral hepatitis C without hepatic coma: Secondary | ICD-10-CM | POA: Insufficient documentation

## 2015-06-18 DIAGNOSIS — C220 Liver cell carcinoma: Secondary | ICD-10-CM

## 2015-06-18 DIAGNOSIS — R627 Adult failure to thrive: Secondary | ICD-10-CM
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
## 2015-06-18 NOTE — Progress Notes (Signed)
Patient ID: Jeffery Rhodes, male   DOB: 07/22/1929, 81 y.o.   MRN: [id number]  [facility] [place]     No Known Allergies     Chief Complaint  Patient presents with  . Hospitalization Follow-up    HPI:  He has been hospitalized due to his failure to thrive from his hepatocellular cancer. His family is unable to meet his need at home. This does represent a long term placement for him. He is unable to fully participate in the hpi ros due to his dementia; but he states that he feels "down, down, down".   Past Medical History  Diagnosis Date  . Dementia   . Cancer   . Liver cancer   . Hepatitis C     Past Surgical History  Procedure Laterality Date  . Hip surgery Left     VITAL SIGNS BP 146/64 mmHg  Pulse 68  Ht 5\' 9"  (1.753 m)  Wt 137 lb 12.8 oz (62.506 kg)  BMI 20.34 kg/m2   Outpatient Encounter Prescriptions as of 06/17/2015  Medication Sig  . amLODipine (NORVASC) 5 MG tablet Take 5 mg by mouth daily.  . clopidogrel (PLAVIX) 75 MG tablet Take 75 mg by mouth daily.  Marland Kitchen donepezil (ARICEPT) 10 MG tablet Take 10 mg by mouth daily.  Marland Kitchen gabapentin (NEURONTIN) 300 MG capsule Take 300 mg by mouth daily.  . ondansetron (ZOFRAN) 4 MG tablet Take 1 tablet (4 mg total) by mouth every 6 (six) hours as needed for nausea.  . polyethylene glycol powder (GLYCOLAX/MIRALAX) powder Take 17 g by mouth.  . simvastatin (ZOCOR) 20 MG tablet Take 20 mg by mouth daily.      SIGNIFICANT DIAGNOSTIC EXAMS  06-12-15: chest x-ray: No active disease.  06-13-15: right elbow x-ray: No acute findings     06-13-15: right hand x-ray: No acute abnormalities.    LABS REVIEWED:   06-12-15: wbc 9.4; hgb 12.5; hct 36.3; mcv 94.5; plt 317; glucose 121; bun 15; creat 0.69; k+3.9; na++135; ast 72; albumin 2.4 06-13-15: wbc 8.0; hgb 12.6; hct 37.1; mcv 95.1; plt 322; glucose 83; bun 12; creat 0.6; k+4.0; na++139; ast 88; albumin 2.2      Review of Systems  Unable to perform ROS    Physical  Exam  Constitutional: No distress.  Frail   Neck: Neck supple. No JVD present.  Cardiovascular: Normal rate, regular rhythm and intact distal pulses.   Respiratory: Effort normal and breath sounds normal. No respiratory distress.  GI: Soft. Bowel sounds are normal. He exhibits no distension. There is no tenderness.  Musculoskeletal: He exhibits no edema.  Is able to move extremities   Neurological: He is alert.  Skin: Skin is warm and dry. He is not diaphoretic.     ASSESSMENT/ PLAN:  1. Hepatocellular carcinoma; hepaitis C: he is not on any treatment at this time.   2 dementia: is late stage; will continue aricept 10 mg daily at this time; will need to monitor this medication based upon any future weight loss.   3. FTT: his current weight is 137 lb 12 oz. His albumin is 2.2. He is not engaging in his surroundings at this time. He will need to be followed by palliative care.   4. Hypertension: will continue norvasc 5 mg daily is on plavix 75 mg daily   5. Dyslipidemia: will continue zocor 20 mg daily; will monitor  6. Constipation: will continue miralax daily   7. Chronic pain: will continue neurontin 300 mg daily  8. Depression: will begin zoloft 50 mg daily and will monitor his status.    Time spent with patient 50 minutes.     Ok Edwards NP [facility]  Contact [phone] Monday through Friday 8am- 5pm  After hours call [phone]

## 2015-07-19 ENCOUNTER — Non-Acute Institutional Stay (SKILLED_NURSING_FACILITY): Payer: Medicare Other | Admitting: Adult Health

## 2015-07-19 DIAGNOSIS — I10 Essential (primary) hypertension: Secondary | ICD-10-CM | POA: Diagnosis not present

## 2015-07-19 DIAGNOSIS — R627 Adult failure to thrive: Secondary | ICD-10-CM | POA: Diagnosis not present

## 2015-07-19 DIAGNOSIS — G8929 Other chronic pain: Secondary | ICD-10-CM

## 2015-07-19 DIAGNOSIS — K5900 Constipation, unspecified: Secondary | ICD-10-CM | POA: Diagnosis not present

## 2015-07-19 DIAGNOSIS — F0390 Unspecified dementia without behavioral disturbance: Secondary | ICD-10-CM

## 2015-07-19 DIAGNOSIS — K5909 Other constipation: Secondary | ICD-10-CM

## 2015-07-19 DIAGNOSIS — C220 Liver cell carcinoma: Secondary | ICD-10-CM | POA: Diagnosis not present

## 2015-07-22 ENCOUNTER — Other Ambulatory Visit: Payer: Self-pay | Admitting: *Deleted

## 2015-07-22 MED ORDER — TRAMADOL HCL 50 MG PO TABS: ORAL_TABLET | ORAL | Status: DC

## 2015-07-22 NOTE — Telephone Encounter (Signed)
[facility] 

## 2015-08-19 ENCOUNTER — Other Ambulatory Visit: Payer: Self-pay | Admitting: *Deleted

## 2015-08-19 MED ORDER — OXYCODONE HCL 5 MG PO TABS: ORAL_TABLET | ORAL | Status: DC

## 2015-08-19 NOTE — Telephone Encounter (Signed)
[facility] 

## 2015-08-26 ENCOUNTER — Encounter: Payer: Self-pay | Admitting: Adult Health

## 2015-08-26 ENCOUNTER — Non-Acute Institutional Stay (SKILLED_NURSING_FACILITY): Payer: Medicare Other | Admitting: Adult Health

## 2015-08-26 DIAGNOSIS — E118 Type 2 diabetes mellitus with unspecified complications: Secondary | ICD-10-CM

## 2015-08-26 DIAGNOSIS — I10 Essential (primary) hypertension: Secondary | ICD-10-CM

## 2015-08-26 DIAGNOSIS — R627 Adult failure to thrive: Secondary | ICD-10-CM | POA: Diagnosis not present

## 2015-08-26 DIAGNOSIS — C220 Liver cell carcinoma: Secondary | ICD-10-CM

## 2015-08-26 DIAGNOSIS — K5900 Constipation, unspecified: Secondary | ICD-10-CM | POA: Diagnosis not present

## 2015-08-26 DIAGNOSIS — G8929 Other chronic pain: Secondary | ICD-10-CM | POA: Diagnosis not present

## 2015-08-26 DIAGNOSIS — K5909 Other constipation: Secondary | ICD-10-CM | POA: Insufficient documentation

## 2015-08-26 DIAGNOSIS — F0390 Unspecified dementia without behavioral disturbance: Secondary | ICD-10-CM | POA: Diagnosis not present

## 2015-08-26 NOTE — Progress Notes (Signed)
Patient ID: Jeffery Rhodes, male   DOB: [birth year], 81 y.o.   MRN: [id number]    Facility: [facility]      No Known Allergies  Chief Complaint  Patient presents with  . Medical Management of Chronic Issues    HPI:  He is a long term resident of this facility being seen for the management of his chronic illnesses.  He is not able to fully participate in the hpi or ros. He tell me today that he is feeling good. There are no nursing concerns being voiced today.    Past Medical History  Diagnosis Date  . Dementia   . Cancer   . Liver cancer   . Hepatitis C     Past Surgical History  Procedure Laterality Date  . Hip surgery Left     VITAL SIGNS BP 130/70 mmHg  Pulse 74  Ht 5\' 7"  (1.702 m)  Wt 136 lb (61.689 kg)  BMI 21.30 kg/m2  Patient's Medications  New Prescriptions   No medications on file  Previous Medications   AMLODIPINE (NORVASC) 5 MG TABLET    Take 5 mg by mouth daily.   CLOPIDOGREL (PLAVIX) 75 MG TABLET    Take 75 mg by mouth daily.   DONEPEZIL (ARICEPT) 10 MG TABLET    Take 10 mg by mouth at bedtime.   GABAPENTIN (NEURONTIN) 300 MG CAPSULE    Take 300 mg by mouth daily.   ONDANSETRON (ZOFRAN) 4 MG TABLET    Take 1 tablet (4 mg total) by mouth every 6 (six) hours as needed for nausea.   OXYCODONE (ROXICODONE) 5 MG IMMEDIATE RELEASE TABLET    Take one tablet by mouth every 4 hours as needed for pain   POLYETHYLENE GLYCOL POWDER (GLYCOLAX/MIRALAX) POWDER    Take 17 g by mouth.   TRAMADOL (ULTRAM) 50 MG TABLET    Take one tablet by mouth every 6 hours as needed for pain. Max 300mg  tramadol/day  Modified Medications   No medications on file  Discontinued Medications   No medications on file     SIGNIFICANT DIAGNOSTIC EXAMS  06-12-15: chest x-ray: No active disease.  06-13-15: right elbow x-ray: No acute findings     06-13-15: right hand x-ray: No acute abnormalities.    LABS REVIEWED:   06-12-15: wbc 9.4; hgb 12.5; hct 36.3; mcv 94.5;  plt 317; glucose 121; bun 15; creat 0.69; k+3.9; na++135; ast 72; albumin 2.4;  06-13-15: wbc 8.0; hgb 12.6; hct 37.1; mcv 95.1; plt 322; glucose 83; bun 12; creat 0.6; k+4.0; na++139; ast 88; albumin 2.2        Review of Systems Unable to perform ROS: Dementia     Physical Exam Constitutional: No distress.  Thin   Eyes: Conjunctivae are normal.  Neck: Neck supple. No JVD present. No thyromegaly present.  Cardiovascular: Normal rate, regular rhythm and intact distal pulses.   Respiratory: Effort normal and breath sounds normal. No respiratory distress. He has no wheezes.  GI: Soft. Bowel sounds are normal. He exhibits no distension. There is no tenderness.  Musculoskeletal: He exhibits no edema.  Able to move all extremities   Lymphadenopathy:    He has no cervical adenopathy.  Neurological: He is alert.  Skin: Skin is warm and dry. He is not diaphoretic.  Psychiatric: He has a normal mood and affect.     ASSESSMENT/ PLAN:  1. Hepatocellular carcinoma; hepaitis C: he is not on any treatment at this time.   2 dementia:  is late stage; is on aricept 10 mg nightly ; his current weight is 136 pounds and stable; will not make changes will monitor    3. FTT: his current weight is 136 pounds; is stable. His albumin is 2.2. He is more engaging to those around him. Will continue supplements per facility protocol and will monitor  4. Hypertension: will continue norvasc 5 mg daily is on plavix 75 mg daily   5. Dyslipidemia: will is currently off medications;  Will monitor   6. Constipation: will continue miralax daily   7. Chronic pain: will continue neurontin 300 mg daily and will stop  ultram 50 mg every 6 hours  As needed as he has not used this medication  8. Depression: will continue zoloft 50 mg daily and will monitor his status.   9. Diabetes: his hgb a1c is 7.7; is presently not on medications; will not make changes at this time will monitor his status     Ok Edwards NP [facility]  Contact [phone] Monday through Friday 8am- 5pm  After hours call [phone]

## 2015-08-26 NOTE — Progress Notes (Signed)
Patient ID: Jeffery Rhodes, male   DOB: 1929/03/22, 81 y.o.   MRN: [id number]   Facility: [facility]      No Known Allergies  Chief Complaint  Patient presents with  . Medical Management of Chronic Issues    HPI:  He is a long term resident of this facility being seen for the management of his chronic illnesses.  Since starting his zoloft 50 mg daily; he has become more engaging with those around him. He is unable to fully participate in the hpi; but is telling me that he is having pain "all over".    Past Medical History  Diagnosis Date  . Dementia   . Cancer   . Liver cancer   . Hepatitis C     Past Surgical History  Procedure Laterality Date  . Hip surgery Left     VITAL SIGNS BP 122/70 mmHg  Pulse 80  Ht 5\' 7"  (1.702 m)  Wt 135 lb (61.236 kg)  BMI 21.14 kg/m2  Patient's Medications  New Prescriptions   No medications on file  Previous Medications   AMLODIPINE (NORVASC) 5 MG TABLET    Take 5 mg by mouth daily.   CLOPIDOGREL (PLAVIX) 75 MG TABLET    Take 75 mg by mouth daily.   GABAPENTIN (NEURONTIN) 300 MG CAPSULE    Take 300 mg by mouth daily.   ONDANSETRON (ZOFRAN) 4 MG TABLET    Take 1 tablet (4 mg total) by mouth every 6 (six) hours as needed for nausea.   OXYCODONE (ROXICODONE) 5 MG IMMEDIATE RELEASE TABLET    Take one tablet by mouth every 4 hours as needed for pain   POLYETHYLENE GLYCOL POWDER (GLYCOLAX/MIRALAX) POWDER    Take 17 g by mouth.   SIMVASTATIN (ZOCOR) 20 MG TABLET    Take 20 mg by mouth daily.  Modified Medications   No medications on file  Discontinued Medications     SIGNIFICANT DIAGNOSTIC EXAMS   06-12-15: chest x-ray: No active disease.  06-13-15: right elbow x-ray: No acute findings     06-13-15: right hand x-ray: No acute abnormalities.    LABS REVIEWED:   06-12-15: wbc 9.4; hgb 12.5; hct 36.3; mcv 94.5; plt 317; glucose 121; bun 15; creat 0.69; k+3.9; na++135; ast 72; albumin 2.4;  06-13-15: wbc 8.0; hgb 12.6;  hct 37.1; mcv 95.1; plt 322; glucose 83; bun 12; creat 0.6; k+4.0; na++139; ast 88; albumin 2.2       Review of Systems  Unable to perform ROS: Dementia      Physical Exam  Constitutional: No distress.  Thin   Eyes: Conjunctivae are normal.  Neck: Neck supple. No JVD present. No thyromegaly present.  Cardiovascular: Normal rate, regular rhythm and intact distal pulses.   Respiratory: Effort normal and breath sounds normal. No respiratory distress. He has no wheezes.  GI: Soft. Bowel sounds are normal. He exhibits no distension. There is no tenderness.  Musculoskeletal: He exhibits no edema.  Able to move all extremities   Lymphadenopathy:    He has no cervical adenopathy.  Neurological: He is alert.  Skin: Skin is warm and dry. He is not diaphoretic.  Psychiatric: He has a normal mood and affect.       ASSESSMENT/ PLAN:  1. Hepatocellular carcinoma; hepaitis C: he is not on any treatment at this time.   2 dementia: is late stage; he is currently not on medications; his current weight is 135 pounds; will not make changes will monitor  3. FTT: his current weight is 135 pounds. His albumin is 2.2. He is more engaging to those around him. Will continue supplements per facility protocol and will monitor  4. Hypertension: will continue norvasc 5 mg daily is on plavix 75 mg daily   5. Dyslipidemia: will stop the zocor his ldl is 75;  Will monitor   6. Constipation: will continue miralax daily   7. Chronic pain: will continue neurontin 300 mg daily and will begin ultram 50 mg every 6 hours   8. Depression: will continue zoloft 50 mg daily and will monitor his status.   9. Diabetes: his hgb a1c is 7.7; is presently not on medications; will not make changes at this time will monitor his status     Ok Edwards NP [facility]  Contact [phone] Monday through Friday 8am- 5pm  After hours call [phone]

## 2015-10-04 ENCOUNTER — Non-Acute Institutional Stay (SKILLED_NURSING_FACILITY): Payer: Medicare Other | Admitting: Adult Health

## 2015-10-04 DIAGNOSIS — F0390 Unspecified dementia without behavioral disturbance: Secondary | ICD-10-CM

## 2015-10-04 DIAGNOSIS — C220 Liver cell carcinoma: Secondary | ICD-10-CM | POA: Diagnosis not present

## 2015-10-04 DIAGNOSIS — I10 Essential (primary) hypertension: Secondary | ICD-10-CM | POA: Diagnosis not present

## 2015-10-04 DIAGNOSIS — K5900 Constipation, unspecified: Secondary | ICD-10-CM

## 2015-10-04 DIAGNOSIS — G8929 Other chronic pain: Secondary | ICD-10-CM

## 2015-10-04 DIAGNOSIS — B182 Chronic viral hepatitis C: Secondary | ICD-10-CM | POA: Diagnosis not present

## 2015-10-04 DIAGNOSIS — R627 Adult failure to thrive: Secondary | ICD-10-CM | POA: Diagnosis not present

## 2015-10-04 DIAGNOSIS — E118 Type 2 diabetes mellitus with unspecified complications: Secondary | ICD-10-CM

## 2015-10-04 DIAGNOSIS — K5909 Other constipation: Secondary | ICD-10-CM

## 2015-10-15 LAB — CBC AND DIFFERENTIAL
HEMATOCRIT: 38 % — AB (ref 41–53)
HEMOGLOBIN: 13.1 g/dL — AB (ref 13.5–17.5)
PLATELETS: 142 10*3/uL — AB (ref 150–399)
WBC: 8.2 10*3/mL

## 2015-10-15 LAB — BASIC METABOLIC PANEL
BUN: 16 mg/dL (ref 4–21)
Creatinine: 0.8 mg/dL (ref 0.6–1.3)
Glucose: 179 mg/dL
POTASSIUM: 4 mmol/L (ref 3.4–5.3)
Sodium: 134 mmol/L — AB (ref 137–147)

## 2015-10-15 LAB — HEPATIC FUNCTION PANEL
ALT: 54 U/L — AB (ref 10–40)
AST: 81 U/L — AB (ref 14–40)
Alkaline Phosphatase: 121 U/L (ref 25–125)
Bilirubin, Total: 0.7 mg/dL

## 2015-10-29 ENCOUNTER — Non-Acute Institutional Stay (SKILLED_NURSING_FACILITY): Payer: Medicare Other | Admitting: Internal Medicine

## 2015-10-29 DIAGNOSIS — C220 Liver cell carcinoma: Secondary | ICD-10-CM

## 2015-10-29 DIAGNOSIS — F0390 Unspecified dementia without behavioral disturbance: Secondary | ICD-10-CM | POA: Diagnosis not present

## 2015-10-29 DIAGNOSIS — G8929 Other chronic pain: Secondary | ICD-10-CM | POA: Diagnosis not present

## 2015-10-29 DIAGNOSIS — R627 Adult failure to thrive: Secondary | ICD-10-CM

## 2015-10-29 DIAGNOSIS — B182 Chronic viral hepatitis C: Secondary | ICD-10-CM

## 2015-10-29 DIAGNOSIS — E118 Type 2 diabetes mellitus with unspecified complications: Secondary | ICD-10-CM | POA: Diagnosis not present

## 2015-10-30 ENCOUNTER — Encounter: Payer: Self-pay | Admitting: Internal Medicine

## 2015-10-30 NOTE — Progress Notes (Signed)
Patient ID: Jeffery Rhodes, male   DOB: 03-24-1929, 81 y.o.   MRN: [id number]    DATE: 10/29/15  Location:  [place] of Service: SNF [phone])   Extended Emergency Contact Information Primary Emergency Contact: [place] Phone: [phone] Relation: Grandaughter  Advanced Directive information  FULL CODE  Chief Complaint  Patient presents with  . Medical Management of Chronic Issues    HPI:  81 yo male long term resident seen today for f/u. Nursing notes pt decline cognitively. He is total care. No falls. Appetite poor.  FTT - due to dementia and HCC. He takes nutritional supplements  HCC/hx Hep C - poor family support. Palliative care has seen pt. He takes roxicodone prn  Dementia - stable on aricept.   HTN - BP stable on amlodipine. He takes plavix. Lisinopril and lasix stopped at hospital in June  Hyperlipidemia - diet controlled  Constipation - stable on miralax. Takes zofran prn  Arm pain - stable on neurontin; w/u neg during the admission   DM - diet controlled  Past Medical History  Diagnosis Date  . Dementia   . Cancer ([place])   . Liver cancer ([place])   . Hepatitis C     Past Surgical History  Procedure Laterality Date  . Hip surgery Left     Patient Care Team: Omer Jack, MD as PCP - General (Internal Medicine)  Social History   Social History  . Marital Status: Divorced    Spouse Name: N/A  . Number of Children: N/A  . Years of Education: N/A   Occupational History  . Not on file.   Social History Main Topics  . Smoking status: Never Smoker   . Smokeless tobacco: Not on file  . Alcohol Use: No  . Drug Use: Not on file  . Sexual Activity: Not on file   Other Topics Concern  . Not on file   Social History Narrative     reports that he has never smoked. He does not have any smokeless tobacco history on file. He reports that he does not drink alcohol. His drug  history is not on file.   There is no immunization history on file for this patient.  No Known Allergies  Medications: Patient's Medications  New Prescriptions   No medications on file  Previous Medications   AMLODIPINE (NORVASC) 5 MG TABLET    Take 5 mg by mouth daily.   CLOPIDOGREL (PLAVIX) 75 MG TABLET    Take 75 mg by mouth daily.   DONEPEZIL (ARICEPT) 10 MG TABLET    Take 10 mg by mouth at bedtime.   GABAPENTIN (NEURONTIN) 300 MG CAPSULE    Take 300 mg by mouth daily.   ONDANSETRON (ZOFRAN) 4 MG TABLET    Take 1 tablet (4 mg total) by mouth every 6 (six) hours as needed for nausea.   OXYCODONE (ROXICODONE) 5 MG IMMEDIATE RELEASE TABLET    Take one tablet by mouth every 4 hours as needed for pain   POLYETHYLENE GLYCOL POWDER (GLYCOLAX/MIRALAX) POWDER    Take 17 g by mouth.   TRAMADOL (ULTRAM) 50 MG TABLET    Take one tablet by mouth every 6 hours as needed for pain. Max $RemoveB'300mg'[id number]$  tramadol/day  Modified Medications   No medications on file  Discontinued Medications   No medications on file    Review of Systems  Unable to perform ROS: Dementia    Filed  Vitals:   10/29/15 1138  BP: 123/66  Pulse: 78  Temp: 97.7 F (36.5 C)  Weight: 137 lb 9.6 oz (62.415 kg)  SpO2: 97%   Body mass index is 21.55 kg/(m^2).  Physical Exam  Constitutional:  Frail appearing in NAD lying in bed. Temporal wasting  HENT:  Mouth/Throat: Oropharynx is clear and moist.  Eyes: Pupils are equal, round, and reactive to light. No scleral icterus.  Neck: Neck supple. Carotid bruit is not present. No thyromegaly present.  Cardiovascular: Normal rate, regular rhythm and intact distal pulses.  Exam reveals no gallop and no friction rub.   Murmur (1/6 SEM) heard. no distal LE swelling. No calf TTP  Pulmonary/Chest: Effort normal and breath sounds normal. He has no wheezes. He has no rales. He exhibits no tenderness.  Abdominal: Soft. Bowel sounds are normal. He exhibits no distension, no abdominal bruit,  no pulsatile midline mass and no mass. There is no splenomegaly or hepatomegaly. There is no tenderness. There is no rebound and no guarding.  Lymphadenopathy:    He has no cervical adenopathy.  Neurological: He is alert.  Skin: Skin is warm and dry. No rash noted.  Psychiatric: He has a normal mood and affect. His behavior is normal.     Labs reviewed: No visits with results within 3 Month(s) from this visit. Latest known visit with results is:  Admission on 06/12/2015, Discharged on 06/13/2015  Component Date Value Ref Range Status  . WBC 06/12/2015 9.4  4.0 - 10.5 K/uL Final  . RBC 06/12/2015 3.84* 4.22 - 5.81 MIL/uL Final  . Hemoglobin 06/12/2015 12.5* 13.0 - 17.0 g/dL Final  . HCT 06/12/2015 36.3* 39.0 - 52.0 % Final  . MCV 06/12/2015 94.5  78.0 - 100.0 fL Final  . MCH 06/12/2015 32.6  26.0 - 34.0 pg Final  . MCHC 06/12/2015 34.4  30.0 - 36.0 g/dL Final  . RDW 06/12/2015 11.6  11.5 - 15.5 % Final  . Platelets 06/12/2015 317  150 - 400 K/uL Final  . Color, Urine 06/12/2015 YELLOW  YELLOW Final  . APPearance 06/12/2015 CLEAR  CLEAR Final  . Specific Gravity, Urine 06/12/2015 1.018  1.005 - 1.030 Final  . pH 06/12/2015 7.0  5.0 - 8.0 Final  . Glucose, UA 06/12/2015 NEGATIVE  NEGATIVE mg/dL Final  . Hgb urine dipstick 06/12/2015 NEGATIVE  NEGATIVE Final  . Bilirubin Urine 06/12/2015 NEGATIVE  NEGATIVE Final  . Ketones, ur 06/12/2015 15* NEGATIVE mg/dL Final  . Protein, ur 06/12/2015 NEGATIVE  NEGATIVE mg/dL Final  . Urobilinogen, UA 06/12/2015 4.0* 0.0 - 1.0 mg/dL Final  . Nitrite 06/12/2015 NEGATIVE  NEGATIVE Final  . Leukocytes, UA 06/12/2015 NEGATIVE  NEGATIVE Final   MICROSCOPIC NOT DONE ON URINES WITH NEGATIVE PROTEIN, BLOOD, LEUKOCYTES, NITRITE, OR GLUCOSE <1000 mg/dL.  Marland Kitchen Sodium 06/12/2015 135  135 - 145 mmol/L Final  . Potassium 06/12/2015 3.9  3.5 - 5.1 mmol/L Final  . Chloride 06/12/2015 103  101 - 111 mmol/L Final  . CO2 06/12/2015 24  22 - 32 mmol/L Final  .  Glucose, Bld 06/12/2015 121* 65 - 99 mg/dL Final  . BUN 06/12/2015 15  6 - 20 mg/dL Final  . Creatinine, Ser 06/12/2015 0.69  0.61 - 1.24 mg/dL Final  . Calcium 06/12/2015 8.8* 8.9 - 10.3 mg/dL Final  . Total Protein 06/12/2015 7.7  6.5 - 8.1 g/dL Final  . Albumin 06/12/2015 2.4* 3.5 - 5.0 g/dL Final  . AST 06/12/2015 72* 15 - 41 U/L Final  .  ALT 06/12/2015 49  17 - 63 U/L Final  . Alkaline Phosphatase 06/12/2015 58  38 - 126 U/L Final  . Total Bilirubin 06/12/2015 1.0  0.3 - 1.2 mg/dL Final  . GFR calc non Af Amer 06/12/2015 >60  >60 mL/min Final  . GFR calc Af Amer 06/12/2015 >60  >60 mL/min Final   Comment: (NOTE) The eGFR has been calculated using the CKD EPI equation. This calculation has not been validated in all clinical situations. eGFR's persistently <60 mL/min signify possible Chronic Kidney Disease.   . Anion gap 06/12/2015 8  5 - 15 Final  . Lactic Acid, Venous 06/12/2015 1.01  0.5 - 2.0 mmol/L Final  . Sodium 06/13/2015 139  135 - 145 mmol/L Final  . Potassium 06/13/2015 4.0  3.5 - 5.1 mmol/L Final  . Chloride 06/13/2015 106  101 - 111 mmol/L Final  . CO2 06/13/2015 25  22 - 32 mmol/L Final  . Glucose, Bld 06/13/2015 83  65 - 99 mg/dL Final  . BUN 06/13/2015 12  6 - 20 mg/dL Final  . Creatinine, Ser 06/13/2015 0.60* 0.61 - 1.24 mg/dL Final  . Calcium 06/13/2015 8.9  8.9 - 10.3 mg/dL Final  . Total Protein 06/13/2015 6.9  6.5 - 8.1 g/dL Final  . Albumin 06/13/2015 2.2* 3.5 - 5.0 g/dL Final  . AST 06/13/2015 88* 15 - 41 U/L Final  . ALT 06/13/2015 52  17 - 63 U/L Final  . Alkaline Phosphatase 06/13/2015 61  38 - 126 U/L Final  . Total Bilirubin 06/13/2015 0.6  0.3 - 1.2 mg/dL Final  . GFR calc non Af Amer 06/13/2015 >60  >60 mL/min Final  . GFR calc Af Amer 06/13/2015 >60  >60 mL/min Final   Comment: (NOTE) The eGFR has been calculated using the CKD EPI equation. This calculation has not been validated in all clinical situations. eGFR's persistently <60 mL/min  signify possible Chronic Kidney Disease.   . Anion gap 06/13/2015 8  5 - 15 Final  . WBC 06/13/2015 8.0  4.0 - 10.5 K/uL Final  . RBC 06/13/2015 3.90* 4.22 - 5.81 MIL/uL Final  . Hemoglobin 06/13/2015 12.6* 13.0 - 17.0 g/dL Final  . HCT 06/13/2015 37.1* 39.0 - 52.0 % Final  . MCV 06/13/2015 95.1  78.0 - 100.0 fL Final  . MCH 06/13/2015 32.3  26.0 - 34.0 pg Final  . MCHC 06/13/2015 34.0  30.0 - 36.0 g/dL Final  . RDW 06/13/2015 12.6  11.5 - 15.5 % Final  . Platelets 06/13/2015 322  150 - 400 K/uL Final  . Prothrombin Time 06/13/2015 15.3* 11.6 - 15.2 seconds Final  . INR 06/13/2015 1.19  0.00 - 1.49 Final    No results found.   Assessment/Plan   ICD-9-CM ICD-10-CM   1. Failure to thrive in adult 783.7 R62.7   2. Dementia without behavioral disturbance 294.20 F03.90   3. Hepatocellular carcinoma (HCC) 155.0 C22.0   4. Chronic hepatitis C without hepatic coma (HCC) 070.54 B18.2   5. Type 2 diabetes mellitus with complication, without long-term current use of insulin (HCC) 250.90 E11.8   6. Chronic pain 338.29 G89.29     Will need to address code status due to declining health. Will complete necessary forms. Prognosis is poor  Cont other meds as ordered  PT/OT/ST as indicated  Will follow  Reeshemah Nazaryan S. Perlie Gold  [facility] [address] [place], [place] [address] [phone] Cell (Monday-Friday 8  AM - 5 PM) [phone] After 5 PM and follow prompts  

## 2015-11-11 LAB — LIPID PANEL
Cholesterol: 219 mg/dL — AB (ref 0–200)
HDL: 31 mg/dL — AB (ref 35–70)
LDL CALC: 152 mg/dL
Triglycerides: 179 mg/dL — AB (ref 40–160)

## 2015-12-02 ENCOUNTER — Non-Acute Institutional Stay (SKILLED_NURSING_FACILITY): Payer: Medicare Other | Admitting: Adult Health

## 2015-12-02 DIAGNOSIS — E118 Type 2 diabetes mellitus with unspecified complications: Secondary | ICD-10-CM | POA: Diagnosis not present

## 2015-12-02 DIAGNOSIS — F329 Major depressive disorder, single episode, unspecified: Secondary | ICD-10-CM | POA: Diagnosis not present

## 2015-12-02 DIAGNOSIS — C220 Liver cell carcinoma: Secondary | ICD-10-CM

## 2015-12-02 DIAGNOSIS — F0390 Unspecified dementia without behavioral disturbance: Secondary | ICD-10-CM | POA: Diagnosis not present

## 2015-12-02 DIAGNOSIS — G8929 Other chronic pain: Secondary | ICD-10-CM | POA: Diagnosis not present

## 2015-12-02 DIAGNOSIS — I10 Essential (primary) hypertension: Secondary | ICD-10-CM

## 2015-12-02 DIAGNOSIS — F32A Depression, unspecified: Secondary | ICD-10-CM

## 2015-12-02 DIAGNOSIS — K5909 Other constipation: Secondary | ICD-10-CM

## 2015-12-02 DIAGNOSIS — K5900 Constipation, unspecified: Secondary | ICD-10-CM

## 2015-12-09 ENCOUNTER — Encounter: Payer: Self-pay | Admitting: Adult Health

## 2015-12-09 NOTE — Progress Notes (Signed)
Patient ID: Jeffery Rhodes, male   DOB: January 19, 1929, 81 y.o.   MRN: [id number]   Facility: [facility]      No Known Allergies  Chief Complaint  Patient presents with  . Medical Management of Chronic Issues    HPI:  He is a long term resident of this facility being seen for the management of his chronic illnesses. Overall there is little change in his status. He has a rash on his arms and legs; which look like dry skin. He does complain of itching present.    Past Medical History  Diagnosis Date  . Dementia   . Cancer ([place])   . Liver cancer ([place])   . Hepatitis C     Past Surgical History  Procedure Laterality Date  . Hip surgery Left     VITAL SIGNS BP 180/65 mmHg  Pulse 86  Ht 5\' 7"  (1.702 m)  Wt 133 lb (60.328 kg)  BMI 20.83 kg/m2  Patient's Medications  New Prescriptions   No medications on file  Previous Medications   AMLODIPINE (NORVASC) 5 MG TABLET    Take 5 mg by mouth daily.   CLOPIDOGREL (PLAVIX) 75 MG TABLET    Take 75 mg by mouth daily.   DONEPEZIL (ARICEPT) 10 MG TABLET    Take 10 mg by mouth at bedtime.   GABAPENTIN (NEURONTIN) 300 MG CAPSULE    Take 300 mg by mouth daily.   ONDANSETRON (ZOFRAN) 4 MG TABLET    Take 1 tablet (4 mg total) by mouth every 6 (six) hours as needed for nausea.   OXYCODONE (ROXICODONE) 5 MG IMMEDIATE RELEASE TABLET    Take one tablet by mouth every 4 hours as needed for pain   POLYETHYLENE GLYCOL POWDER (GLYCOLAX/MIRALAX) POWDER    Take 17 g by mouth.   TRAMADOL (ULTRAM) 50 MG TABLET    Take one tablet by mouth every 6 hours as needed for pain. Max 300mg  tramadol/day  Modified Medications   No medications on file  Discontinued Medications   No medications on file     SIGNIFICANT DIAGNOSTIC EXAMS   06-12-15: chest x-ray: No active disease.  06-13-15: right elbow x-ray: No acute findings     06-13-15: right hand x-ray: No acute abnormalities.    LABS REVIEWED:   06-12-15: wbc 9.4; hgb 12.5; hct 36.3; mcv 94.5; plt 317;  glucose 121; bun 15; creat 0.69; k+3.9; na++135; ast 72; albumin 2.4;  06-13-15: wbc 8.0; hgb 12.6; hct 37.1; mcv 95.1; plt 322; glucose 83; bun 12; creat 0.6; k+4.0; na++139; ast 88; albumin 2.2        Review of Systems Unable to perform ROS: Dementia     Physical Exam Constitutional: No distress.  Thin   Eyes: Conjunctivae are normal.  Neck: Neck supple. No JVD present. No thyromegaly present.  Cardiovascular: Normal rate, regular rhythm and intact distal pulses.   Respiratory: Effort normal and breath sounds normal. No respiratory distress. He has no wheezes.  GI: Soft. Bowel sounds are normal. He exhibits no distension. There is no tenderness.  Musculoskeletal: He exhibits no edema.  Able to move all extremities   Lymphadenopathy:    He has no cervical adenopathy.  Neurological: He is alert.  Skin: Skin is warm and dry. He is not diaphoretic. Has dry flaky skin on his arms and legs  Psychiatric: He has a normal mood and affect.     ASSESSMENT/ PLAN:  1. Hepatocellular carcinoma; hepaitis C: he is not on any treatment  at this time.   2 dementia: is late stage; is on aricept 10 mg nightly ; his current weight is 133 pounds and stable; Jeffery not make changes Jeffery monitor    3. FTT: his current weight is 133 pounds; is stable. His albumin is 2.2. He is more engaging to those around him. Jeffery continue supplements per facility protocol and Jeffery monitor  4. Hypertension: Jeffery increase his norvasc to 10 mg daily Jeffery have nursing check his blood pressure twice daily  is on plavix 75 mg daily   5. Dyslipidemia: Jeffery is currently off medications;  Jeffery monitor   6. Constipation: Jeffery continue miralax daily   7. Chronic pain: Jeffery continue neurontin 300 mg daily Jeffery monitor  8. Depression: Jeffery continue zoloft 50 mg daily and Jeffery monitor his status.   9. Diabetes: his hgb a1c is 7.7; is presently not on medications; Jeffery not make changes at this time Jeffery monitor his  status  10. Eczema: Jeffery use triamcinolone 0.1 % oint twice daily and Jeffery monitor       Ok Edwards NP [facility]  Contact [phone] Monday through Friday 8am- 5pm  After hours call [phone]

## 2015-12-20 ENCOUNTER — Encounter: Payer: Self-pay | Admitting: Adult Health

## 2015-12-20 DIAGNOSIS — F32A Depression, unspecified: Secondary | ICD-10-CM | POA: Insufficient documentation

## 2015-12-20 DIAGNOSIS — F329 Major depressive disorder, single episode, unspecified: Secondary | ICD-10-CM | POA: Insufficient documentation

## 2015-12-20 NOTE — Progress Notes (Signed)
Patient ID: Jeffery Rhodes, male   DOB: 02-11-1929, 81 y.o.   MRN: [id number]    Facility: [facility]      No Known Allergies  Chief Complaint  Patient presents with  . Medical Management of Chronic Issues    HPI:  He is a resident of this facility being seen for the management of his chronic illnesses. Overall his status is stable. He is not voicing any complaints or concerns at this time. There are no nursing concerns at this time.   Past Medical History  Diagnosis Date  . Dementia   . Cancer ([place])   . Liver cancer ([place])   . Hepatitis C     Past Surgical History  Procedure Laterality Date  . Hip surgery Left     VITAL SIGNS BP 130/74 mmHg  Pulse 82  Ht 5\' 7"  (1.702 m)  Wt 137 lb 9.6 oz (62.415 kg)  BMI 21.55 kg/m2  SpO2 95%  Patient's Medications  New Prescriptions   No medications on file  Previous Medications   AMLODIPINE (NORVASC) 10 MG TABLET    Take 10 mg by mouth daily.   CLOPIDOGREL (PLAVIX) 75 MG TABLET    Take 75 mg by mouth daily.   DONEPEZIL (ARICEPT) 10 MG TABLET    Take 10 mg by mouth at bedtime.   GABAPENTIN (NEURONTIN) 300 MG CAPSULE    Take 300 mg by mouth daily.   ONDANSETRON (ZOFRAN) 4 MG TABLET    Take 1 tablet (4 mg total) by mouth every 6 (six) hours as needed for nausea.   OXYCODONE (ROXICODONE) 5 MG IMMEDIATE RELEASE TABLET    Take one tablet by mouth every 4 hours as needed for pain   POLYETHYLENE GLYCOL POWDER (GLYCOLAX/MIRALAX) POWDER    Take 17 g by mouth.   TRAMADOL (ULTRAM) 50 MG TABLET    Take one tablet by mouth every 6 hours as needed for pain. Max 300mg  tramadol/day  Modified Medications   No medications on file  Discontinued Medications   No medications on file     SIGNIFICANT DIAGNOSTIC EXAMS  06-12-15: chest x-ray: No active disease.  06-13-15: right elbow x-ray: No acute findings  06-13-15: right hand x-ray: No acute abnormalities.    LABS REVIEWED:   06-12-15: wbc 9.4; hgb 12.5; hct 36.3; mcv 94.5; plt 317;  glucose 121; bun 15; creat 0.69; k+3.9; na++135; ast 72; albumin 2.4;  06-13-15: wbc 8.0; hgb 12.6; hct 37.1; mcv 95.1; plt 322; glucose 83; bun 12; creat 0.6; k+4.0; na++139; ast 88; albumin 2.2  11-11-15: chol 219; ldl 152; trig 179; hdl 31        Review of Systems Unable to perform ROS: Dementia     Physical Exam Constitutional: No distress.  Thin   Eyes: Conjunctivae are normal.  Neck: Neck supple. No JVD present. No thyromegaly present.  Cardiovascular: Normal rate, regular rhythm and intact distal pulses.   Respiratory: Effort normal and breath sounds normal. No respiratory distress. He has no wheezes.  GI: Soft. Bowel sounds are normal. He exhibits no distension. There is no tenderness.  Musculoskeletal: He exhibits no edema.  Able to move all extremities   Lymphadenopathy:    He has no cervical adenopathy.  Neurological: He is alert.  Skin: Skin is warm and dry. He is not diaphoretic. Has dry flaky skin on his arms and legs is improving   Psychiatric: He has a normal mood and affect.     ASSESSMENT/ PLAN:  1. Hepatocellular  carcinoma; hepaitis C: he is not on any treatment at this time.   2 dementia: is late stage; is on aricept 10 mg nightly ; his current weight is 137.6 pounds and stable; will not make changes will monitor    3. FTT: his current weight is 137.6 pounds; is stable. His albumin is 2.2. He is more engaging to those around him. Will continue supplements per facility protocol and will monitor  4. Hypertension: will continue norvasc  10 mg daily   is on plavix 75 mg daily   5. Dyslipidemia: will is currently off medications;  Will monitor ldl is 152   6. Constipation: will continue miralax daily   7. Chronic pain: will continue neurontin 300 mg daily will monitor  8. Depression: will continue zoloft 50 mg daily and will monitor his status.   9. Diabetes: his hgb a1c is 7.7; is presently not on medications; will not make changes at this time will  monitor his status  10. Eczema: will use triamcinolone 0.1 % oint twice daily and will monitor            Ok Edwards NP [facility]  Contact [phone] Monday through Friday 8am- 5pm  After hours call [phone]

## 2016-01-02 ENCOUNTER — Non-Acute Institutional Stay (SKILLED_NURSING_FACILITY): Payer: Medicare Other | Admitting: Adult Health

## 2016-01-02 DIAGNOSIS — R627 Adult failure to thrive: Secondary | ICD-10-CM | POA: Diagnosis not present

## 2016-01-02 DIAGNOSIS — B182 Chronic viral hepatitis C: Secondary | ICD-10-CM

## 2016-01-02 DIAGNOSIS — E118 Type 2 diabetes mellitus with unspecified complications: Secondary | ICD-10-CM | POA: Diagnosis not present

## 2016-01-02 DIAGNOSIS — F0390 Unspecified dementia without behavioral disturbance: Secondary | ICD-10-CM

## 2016-01-02 DIAGNOSIS — I10 Essential (primary) hypertension: Secondary | ICD-10-CM

## 2016-01-02 DIAGNOSIS — K5900 Constipation, unspecified: Secondary | ICD-10-CM

## 2016-01-02 DIAGNOSIS — K5909 Other constipation: Secondary | ICD-10-CM

## 2016-01-30 ENCOUNTER — Non-Acute Institutional Stay (SKILLED_NURSING_FACILITY): Payer: Medicare Other | Admitting: Adult Health

## 2016-01-30 DIAGNOSIS — B182 Chronic viral hepatitis C: Secondary | ICD-10-CM | POA: Diagnosis not present

## 2016-01-30 DIAGNOSIS — I10 Essential (primary) hypertension: Secondary | ICD-10-CM

## 2016-01-30 DIAGNOSIS — R627 Adult failure to thrive: Secondary | ICD-10-CM | POA: Diagnosis not present

## 2016-01-30 DIAGNOSIS — C220 Liver cell carcinoma: Secondary | ICD-10-CM | POA: Diagnosis not present

## 2016-01-30 DIAGNOSIS — F0390 Unspecified dementia without behavioral disturbance: Secondary | ICD-10-CM

## 2016-02-18 NOTE — Progress Notes (Signed)
Patient ID: Jeffery Rhodes, male   DOB: 1929/10/30, 82 y.o.   MRN: [id number]      Facility: [facility]       No Known Allergies  Chief Complaint  Patient presents with  . Medical Management of Chronic Issues    HPI:  He is a long term resident of this facility being seen for the management of his chronic illnesses. Overall there is little change in his status. His weight is stable from oct 2016 of 133 pounds to his current weight of 130 pounds. His unable to fully participate in the phi or ros; but tells me that he is feeling good. There are no nursing concerns at this time.    Past Medical History  Diagnosis Date  . Dementia   . Cancer ([place])   . Liver cancer ([place])   . Hepatitis C     Past Surgical History  Procedure Laterality Date  . Hip surgery Left     VITAL SIGNS BP 138/72 mmHg  Pulse 90  Ht '5\' 7"'$  (1.702 m)  Wt 130 lb (58.968 kg)  BMI 20.36 kg/m2  Patient's Medications  New Prescriptions   No medications on file  Previous Medications   AMLODIPINE (NORVASC) 5 MG TABLET    Take 10 mg by mouth daily.    CLOPIDOGREL (PLAVIX) 75 MG TABLET    Take 75 mg by mouth daily.   DONEPEZIL (ARICEPT) 10 MG TABLET    Take 10 mg by mouth at bedtime.   GABAPENTIN (NEURONTIN) 300 MG CAPSULE    Take 300 mg by mouth daily.   OXYCODONE (ROXICODONE) 5 MG IMMEDIATE RELEASE TABLET    Take one tablet by mouth every 4 hours as needed for pain   POLYETHYLENE GLYCOL POWDER (GLYCOLAX/MIRALAX) POWDER    Take 17 g by mouth.   SERTRALINE (ZOLOFT) 50 MG TABLET    Take 75 mg by mouth daily.  Modified Medications   No medications on file  Discontinued Medications     SIGNIFICANT DIAGNOSTIC EXAMS   06-12-15: chest x-ray: No active disease.  06-13-15: right elbow x-ray: No acute findings  06-13-15: right hand x-ray: No acute abnormalities.    LABS REVIEWED:   06-12-15: wbc 9.4; hgb 12.5; hct 36.3; mcv 94.5; plt 317; glucose 121; bun 15; creat 0.69; k+3.9; na++135; ast 72; albumin  2.4;  06-13-15: wbc 8.0; hgb 12.6; hct 37.1; mcv 95.1; plt 322; glucose 83; bun 12; creat 0.6; k+4.0; na++139; ast 88; albumin 2.2  10-15-15: wbc 8.2; hgb 13.1; hct 37.5; mcv 91.7; plt 142; glucose 179; bun 16; creat 0.80; k+ 4.0; na++134; ast 18; alt 54; alk phos 121; albumin 2.7  11-11-15: chol 219; ldl 152; trig 179; hdl 31        Review of Systems Unable to perform ROS: Dementia     Physical Exam Constitutional: No distress.  Thin   Eyes: Conjunctivae are normal.  Neck: Neck supple. No JVD present. No thyromegaly present.  Cardiovascular: Normal rate, regular rhythm and intact distal pulses.   Respiratory: Effort normal and breath sounds normal. No respiratory distress. He has no wheezes.  GI: Soft. Bowel sounds are normal. He exhibits no distension. There is no tenderness.  Musculoskeletal: He exhibits no edema.  Able to move all extremities   Lymphadenopathy:    He has no cervical adenopathy.  Neurological: He is alert.  Skin: Skin is warm and dry. He is not diaphoretic. Psychiatric: He has a normal mood and affect.  ASSESSMENT/ PLAN:  1. Hepatocellular carcinoma; hepaitis C: he is not on any treatment at this time.   2 dementia: is late stage; is on aricept 10 mg nightly ; his current weight is 130  pounds and stable; will not make changes will monitor    3. FTT: his current weight is 130 pounds; is stable; his weight in Oct 2016 was 133 pounds. His albumin is 2.7. He is more engaging to those around him. Will continue supplements per facility protocol and will monitor  4. Hypertension: will continue norvasc  10 mg daily   is on plavix 75 mg daily   5. Dyslipidemia: will is currently off medications;  Will monitor ldl is 152   6. Constipation: will continue miralax daily   7. Chronic pain: will continue neurontin 300 mg daily will monitor  8. Depression: will continue zoloft 50 mg daily and will monitor his status.   9. Diabetes: his hgb a1c is 7.7; is  presently not on medications; will not make changes at this time will monitor his status       Ok Edwards NP [facility]  Contact [phone] Monday through Friday 8am- 5pm  After hours call [phone]

## 2016-02-22 ENCOUNTER — Encounter: Payer: Self-pay | Admitting: Adult Health

## 2016-02-22 NOTE — Progress Notes (Signed)
Patient ID: Jeffery Rhodes, male   DOB: 12-04-1929, 82 y.o.   MRN: [id number]    Facility[facility]       No Known Allergies  Chief Complaint  Patient presents with  . Medical Management of Chronic Issues    HPI:  He is a long term resident of this facility being seen for the management of his chronic illnesses. Overall there is little change in his status. He cannot fully participate in the hpi or ros; but does tell me that he is feeling good. There are no nursing concerns at this time   Past Medical History  Diagnosis Date  . Dementia   . Cancer ([place])   . Liver cancer ([place])   . Hepatitis C     Past Surgical History  Procedure Laterality Date  . Hip surgery Left     VITAL SIGNS BP 122/56 mmHg  Pulse 90  Temp(Src) 97.8 F (36.6 C)  Ht '5\' 7"'$  (1.702 m)  Wt 133 lb 9.6 oz (60.601 kg)  BMI 20.92 kg/m2  Patient's Medications  New Prescriptions   No medications on file  Previous Medications   AMLODIPINE (NORVASC) 5 MG TABLET    Take 10 mg by mouth daily.    CLOPIDOGREL (PLAVIX) 75 MG TABLET    Take 75 mg by mouth daily.   DONEPEZIL (ARICEPT) 10 MG TABLET    Take 10 mg by mouth at bedtime.   GABAPENTIN (NEURONTIN) 300 MG CAPSULE    Take 300 mg by mouth daily.   OXYCODONE (ROXICODONE) 5 MG IMMEDIATE RELEASE TABLET    Take one tablet by mouth every 4 hours as needed for pain   POLYETHYLENE GLYCOL POWDER (GLYCOLAX/MIRALAX) POWDER    Take 17 g by mouth.   SERTRALINE (ZOLOFT) 50 MG TABLET    Take 75 mg by mouth daily.  Modified Medications   No medications on file  Discontinued Medications   No medications on file     SIGNIFICANT DIAGNOSTIC EXAMS   06-12-15: chest x-ray: No active disease.  06-13-15: right elbow x-ray: No acute findings  06-13-15: right hand x-ray: No acute abnormalities.    LABS REVIEWED:   06-12-15: wbc 9.4; hgb 12.5; hct 36.3; mcv 94.5; plt 317; glucose 121; bun 15; creat 0.69; k+3.9; na++135; ast 72; albumin 2.4;  06-13-15: wbc 8.0; hgb  12.6; hct 37.1; mcv 95.1; plt 322; glucose 83; bun 12; creat 0.6; k+4.0; na++139; ast 88; albumin 2.2  10-15-15: wbc 8.2; hgb 13.1; hct 37.5; mcv 91.7; plt 142; glucose 179; bun 16; creat 0.80; k+ 4.0; na++134; ast 18; alt 54; alk phos 121; albumin 2.7  11-11-15: chol 219; ldl 152; trig 179; hdl 31        Review of Systems Unable to perform ROS: Dementia     Physical Exam Constitutional: No distress.  Thin   Eyes: Conjunctivae are normal.  Neck: Neck supple. No JVD present. No thyromegaly present.  Cardiovascular: Normal rate, regular rhythm and intact distal pulses.   Respiratory: Effort normal and breath sounds normal. No respiratory distress. He has no wheezes.  GI: Soft. Bowel sounds are normal. He exhibits no distension. There is no tenderness.  Musculoskeletal: He exhibits no edema.  Able to move all extremities   Lymphadenopathy:    He has no cervical adenopathy.  Neurological: He is alert.  Skin: Skin is warm and dry. He is not diaphoretic. Psychiatric: He has a normal mood and affect.     ASSESSMENT/ PLAN:  1. Hepatocellular  carcinoma; hepaitis C: he is not on any treatment at this time.   2 dementia: is late stage; is on aricept 10 mg nightly ; his current weight is 130  pounds and stable; will not make changes will monitor    3. FTT: his current weight is 130 pounds; is stable; his weight in Oct 2016 was 133 pounds. His albumin is 2.7. He is more engaging to those around him. Will continue supplements per facility protocol and will monitor  4. Hypertension: will continue norvasc  10 mg daily   is on plavix 75 mg daily   5. Dyslipidemia: will is currently off medications;  Will monitor ldl is 152   6. Constipation: will continue miralax daily   7. Chronic pain: will continue neurontin 300 mg daily will monitor  8. Depression: will continue zoloft 50 mg daily and will monitor his status.   9. Diabetes: his hgb a1c is 7.7; is presently not on medications; will  not make changes at this time will monitor his status       Ok Edwards NP [facility] Adult [facility]  Contact [phone] Monday through Friday 8am- 5pm  After hours call [phone]

## 2016-03-02 ENCOUNTER — Encounter: Payer: Self-pay | Admitting: Adult Health

## 2016-03-02 ENCOUNTER — Non-Acute Institutional Stay (SKILLED_NURSING_FACILITY): Payer: Medicare Other | Admitting: Adult Health

## 2016-03-02 DIAGNOSIS — C220 Liver cell carcinoma: Secondary | ICD-10-CM | POA: Diagnosis not present

## 2016-03-02 DIAGNOSIS — B182 Chronic viral hepatitis C: Secondary | ICD-10-CM

## 2016-03-02 DIAGNOSIS — G8929 Other chronic pain: Secondary | ICD-10-CM | POA: Diagnosis not present

## 2016-03-02 DIAGNOSIS — F0390 Unspecified dementia without behavioral disturbance: Secondary | ICD-10-CM

## 2016-03-02 DIAGNOSIS — I10 Essential (primary) hypertension: Secondary | ICD-10-CM

## 2016-03-02 NOTE — Progress Notes (Signed)
Patient ID: Jeffery Rhodes, male   DOB: 1929-10-30, 82 y.o.   MRN: [id number]   Facility[facility]       No Known Allergies  Chief Complaint  Patient presents with  . Medical Management of Chronic Issues    Follow up    HPI:  He is a long term resident of this facility being seen for the management of his chronic illnesses. Overall there is little change in his status. He does spend most of his time in bed per his choice. He is unable to fully participate in the hpi or ros; but told me that he is feeling good. There are no nursing concerns at this time. He is taking tamiflu for exposure.    Past Medical History  Diagnosis Date  . Dementia   . Cancer ([place])   . Liver cancer ([place])   . Hepatitis C     Past Surgical History  Procedure Laterality Date  . Hip surgery Left     VITAL SIGNS BP 150/76 mmHg  Pulse 64  Temp(Src) 97 F (36.1 C) (Oral)  Resp 18  Ht '5\' 7"'$  (1.702 m)  Wt 133 lb 6 oz (60.499 kg)  BMI 20.88 kg/m2  SpO2 97%  Patient's Medications  New Prescriptions   No medications on file  Previous Medications   AMLODIPINE (NORVASC) 5 MG TABLET    Take 10 mg by mouth daily.    CLOPIDOGREL (PLAVIX) 75 MG TABLET    Take 75 mg by mouth daily.   DONEPEZIL (ARICEPT) 10 MG TABLET    Take 10 mg by mouth at bedtime.   GABAPENTIN (NEURONTIN) 300 MG CAPSULE    Take 300 mg by mouth daily.   OSELTAMIVIR (TAMIFLU) 75 MG CAPSULE    Take 75 mg by mouth daily. X 14 days until 03/14/16   OXYCODONE (ROXICODONE) 5 MG IMMEDIATE RELEASE TABLET    Take one tablet by mouth every 4 hours as needed for pain   POLYETHYLENE GLYCOL POWDER (GLYCOLAX/MIRALAX) POWDER    Take 17 g by mouth.   SERTRALINE (ZOLOFT) 50 MG TABLET    Take 75 mg by mouth daily.   TRIAMCINOLONE CREAM (KENALOG) 0.5 %    Apply 1 application topically 2 (two) times daily. Apply sparely to rash  Modified Medications   No medications on file  Discontinued Medications   No medications on file     SIGNIFICANT  DIAGNOSTIC EXAMS  06-12-15: chest x-ray: No active disease.  06-13-15: right elbow x-ray: No acute findings  06-13-15: right hand x-ray: No acute abnormalities.    LABS REVIEWED:   06-12-15: wbc 9.4; hgb 12.5; hct 36.3; mcv 94.5; plt 317; glucose 121; bun 15; creat 0.69; k+3.9; na++135; ast 72; albumin 2.4;  06-13-15: wbc 8.0; hgb 12.6; hct 37.1; mcv 95.1; plt 322; glucose 83; bun 12; creat 0.6; k+4.0; na++139; ast 88; albumin 2.2  10-15-15: wbc 8.2; hgb 13.1; hct 37.5; mcv 91.7; plt 142; glucose 179; bun 16; creat 0.80; k+ 4.0; na++134; ast 18; alt 54; alk phos 121; albumin 2.7  11-11-15: chol 219; ldl 152; trig 179; hdl 31        Review of Systems Unable to perform ROS: Dementia     Physical Exam Constitutional: No distress.  Thin   Eyes: Conjunctivae are normal.  Neck: Neck supple. No JVD present. No thyromegaly present.  Cardiovascular: Normal rate, regular rhythm and intact distal pulses.   Respiratory: Effort normal and breath sounds normal. No respiratory distress. He has  no wheezes.  GI: Soft. Bowel sounds are normal. He exhibits no distension. There is no tenderness.  Musculoskeletal: He exhibits no edema.  Able to move all extremities   Lymphadenopathy:    He has no cervical adenopathy.  Neurological: He is alert.  Skin: Skin is warm and dry. He is not diaphoretic. Psychiatric: He has a normal mood and affect.     ASSESSMENT/ PLAN:  1. Hepatocellular carcinoma; hepaitis C: he is not on any treatment at this time.   2 dementia: is late stage; is on aricept 10 mg nightly ; his current weight is 133  pounds and stable; will not make changes will monitor    3. FTT: his current weight is 133 pounds; is stable; his weight in Oct 2016 was 133 pounds. His albumin is 2.7. He is more engaging to those around him. Will continue supplements per facility protocol and will monitor  4. Hypertension: will continue norvasc  10 mg daily   is on plavix 75 mg daily   Will check  blood pressure twice daily  5. Dyslipidemia: will is currently off medications;  Will monitor ldl is 152   6. Constipation: will continue miralax daily   7. Chronic pain: will continue neurontin 300 mg daily will monitor  8. Depression: will continue zoloft 75 mg daily and will monitor his status.   9. Diabetes: his hgb a1c is 7.7; is presently not on medications; will not make changes at this time will monitor his status         Ok Edwards NP [facility]  Contact [phone] Monday through Friday 8am- 5pm  After hours call [phone]

## 2016-04-01 ENCOUNTER — Non-Acute Institutional Stay (SKILLED_NURSING_FACILITY): Payer: Medicare Other | Admitting: Adult Health

## 2016-04-01 ENCOUNTER — Encounter: Payer: Self-pay | Admitting: Adult Health

## 2016-04-01 DIAGNOSIS — I10 Essential (primary) hypertension: Secondary | ICD-10-CM

## 2016-04-01 DIAGNOSIS — C220 Liver cell carcinoma: Secondary | ICD-10-CM

## 2016-04-01 DIAGNOSIS — E118 Type 2 diabetes mellitus with unspecified complications: Secondary | ICD-10-CM | POA: Diagnosis not present

## 2016-04-01 DIAGNOSIS — F0390 Unspecified dementia without behavioral disturbance: Secondary | ICD-10-CM

## 2016-04-01 DIAGNOSIS — K5909 Other constipation: Secondary | ICD-10-CM

## 2016-04-01 DIAGNOSIS — K5900 Constipation, unspecified: Secondary | ICD-10-CM

## 2016-04-01 DIAGNOSIS — G8929 Other chronic pain: Secondary | ICD-10-CM

## 2016-04-01 NOTE — Progress Notes (Signed)
Patient ID: Jeffery Rhodes, male   DOB: 1929/04/30, 82 y.o.   MRN: [id number]   Facility: [facility]       No Known Allergies  Chief Complaint  Patient presents with  . Medical Management of Chronic Issues    Follow up    HPI:  He is a long term resident of this faciltiy being seen for the management of his chronic illnesses. Overall his status is stable. He does spend all of his time in bed per his choice. He is unable to fully participate in the hpi or ros; but did tell me that he is hurting from head to toe.   Past Medical History  Diagnosis Date  . Dementia   . Cancer ([place])   . Liver cancer ([place])   . Hepatitis C     Past Surgical History  Procedure Laterality Date  . Hip surgery Left     VITAL SIGNS BP 144/70 mmHg  Pulse 88  Temp(Src) 97.4 F (36.3 C) (Oral)  Resp 18  Ht '5\' 7"'$  (1.702 m)  Wt 135 lb (61.236 kg)  BMI 21.14 kg/m2  SpO2 97%  Patient's Medications  New Prescriptions   No medications on file  Previous Medications   AMLODIPINE (NORVASC) 5 MG TABLET    Take 10 mg by mouth daily.    CLOPIDOGREL (PLAVIX) 75 MG TABLET    Take 75 mg by mouth daily.   DONEPEZIL (ARICEPT) 10 MG TABLET    Take 10 mg by mouth at bedtime.   GABAPENTIN (NEURONTIN) 300 MG CAPSULE    Take 300 mg by mouth daily.   OXYCODONE (ROXICODONE) 5 MG IMMEDIATE RELEASE TABLET    Take one tablet by mouth every 4 hours as needed for pain   POLYETHYLENE GLYCOL POWDER (GLYCOLAX/MIRALAX) POWDER    Take 17 g by mouth.   SERTRALINE (ZOLOFT) 50 MG TABLET    Take 75 mg by mouth daily.  Modified Medications   No medications on file  Discontinued Medications     SIGNIFICANT DIAGNOSTIC EXAMS  06-12-15: chest x-ray: No active disease.  06-13-15: right elbow x-ray: No acute findings  06-13-15: right hand x-ray: No acute abnormalities.    LABS REVIEWED:   06-12-15: wbc 9.4; hgb 12.5; hct 36.3; mcv 94.5; plt 317; glucose 121; bun 15; creat 0.69; k+3.9; na++135; ast 72; albumin 2.4;  06-13-15:  wbc 8.0; hgb 12.6; hct 37.1; mcv 95.1; plt 322; glucose 83; bun 12; creat 0.6; k+4.0; na++139; ast 88; albumin 2.2  10-15-15: wbc 8.2; hgb 13.1; hct 37.5; mcv 91.7; plt 142; glucose 179; bun 16; creat 0.80; k+ 4.0; na++134; ast 18; alt 54; alk phos 121; albumin 2.7  11-11-15: chol 219; ldl 152; trig 179; hdl 31        Review of Systems Unable to perform ROS: Dementia     Physical Exam Constitutional: No distress.  Thin   Eyes: Conjunctivae are normal.  Neck: Neck supple. No JVD present. No thyromegaly present.  Cardiovascular: Normal rate, regular rhythm and intact distal pulses.   Respiratory: Effort normal and breath sounds normal. No respiratory distress. He has no wheezes.  GI: Soft. Bowel sounds are normal. He exhibits no distension. There is no tenderness.  Musculoskeletal: He exhibits no edema.  Able to move all extremities   Lymphadenopathy:    He has no cervical adenopathy.  Neurological: He is alert.  Skin: Skin is warm and dry. He is not diaphoretic. Psychiatric: He has a normal mood and affect.  ASSESSMENT/ PLAN:  1. Hepatocellular carcinoma; hepaitis C: he is not on any treatment at this time.   2 dementia: is late stage; is on aricept 10 mg nightly ; his current weight is 135  pounds and stable; will not make changes will monitor    3. FTT: his current weight is 135 pounds; is stable; his weight in Oct 2016 was 133 pounds. His albumin is 2.7. He is more engaging to those around him. Will continue supplements per facility protocol and will monitor  4. Hypertension: will continue norvasc  10 mg daily   is on plavix 75 mg daily   Will check blood pressure twice daily  5. Dyslipidemia: will is currently off medications;  Will monitor ldl is 152   6. Constipation: will continue miralax daily   7. Chronic pain: will increase neurontin to 300 mg twice daily  will monitor  8. Depression: will continue zoloft 75 mg daily and will monitor his status.   9.  Diabetes: his hgb a1c is 7.7; is presently not on medications; will not make changes at this time will monitor his status     Ok Edwards NP [facility]  Contact [phone] Monday through Friday 8am- 5pm  After hours call [phone]

## 2016-04-23 ENCOUNTER — Encounter: Payer: Self-pay | Admitting: Adult Health

## 2016-04-23 ENCOUNTER — Non-Acute Institutional Stay (SKILLED_NURSING_FACILITY): Payer: Medicare Other | Admitting: Adult Health

## 2016-04-23 DIAGNOSIS — R410 Disorientation, unspecified: Secondary | ICD-10-CM

## 2016-04-23 DIAGNOSIS — D72829 Elevated white blood cell count, unspecified: Secondary | ICD-10-CM | POA: Diagnosis not present

## 2016-04-23 DIAGNOSIS — N179 Acute kidney failure, unspecified: Secondary | ICD-10-CM | POA: Diagnosis not present

## 2016-04-23 NOTE — Progress Notes (Signed)
Patient ID: Jeffery Rhodes, male   DOB: 01/28/1929, 82 y.o.   MRN: [id number]   Facility: [facility]     CODE STATUS: Full Code  No Known Allergies  Chief Complaint  Patient presents with  . Acute Visit    Change in status    HPI:  Staff reports that he has not eaten or drank well for the past several days. He is not aware of his surroundings. He unable to participate in the hpi or ros. There are no reports of fever present.   Past Medical History  Diagnosis Date  . Dementia   . Cancer ([place])   . Liver cancer ([place])   . Hepatitis C     Past Surgical History  Procedure Laterality Date  . Hip surgery Left     Social History   Social History  . Marital Status: Divorced    Spouse Name: N/A  . Number of Children: N/A  . Years of Education: N/A   Occupational History  . Not on file.   Social History Main Topics  . Smoking status: Never Smoker   . Smokeless tobacco: Not on file  . Alcohol Use: No  . Drug Use: Not on file  . Sexual Activity: Not on file   Other Topics Concern  . Not on file   Social History Narrative   History reviewed. No pertinent family history.    VITAL SIGNS BP 120/76 mmHg  Pulse 76  Temp(Src) 96.8 F (36 C) (Oral)  Resp 16  Ht _0  (1.702 m)  Wt 132 lb 8 oz (60.102 kg)  BMI 20.75 kg/m2  SpO2 97%  Patient's Medications  New Prescriptions   No medications on file  Previous Medications   AMLODIPINE (NORVASC) 5 MG TABLET    Take 10 mg by mouth daily.    CLOPIDOGREL (PLAVIX) 75 MG TABLET    Take 75 mg by mouth daily.   DONEPEZIL (ARICEPT) 10 MG TABLET    Take 10 mg by mouth at bedtime.   GABAPENTIN (NEURONTIN) 300 MG CAPSULE    Take 300 mg by mouth 2 (two) times daily.    OXYCODONE (ROXICODONE) 5 MG IMMEDIATE RELEASE TABLET    Take one tablet by mouth every 4 hours as needed for pain   POLYETHYLENE GLYCOL POWDER (GLYCOLAX/MIRALAX) POWDER    Take 17 g by mouth.   POTASSIUM CHLORIDE ER 20 MEQ TBCR    Take 20 mEq by mouth daily.   SERTRALINE (ZOLOFT) 50 MG TABLET    Take 75 mg by mouth daily.  Modified Medications   No medications on file  Discontinued Medications   No medications on file     SIGNIFICANT DIAGNOSTIC EXAMS  06-12-15: chest x-ray: No active disease.  06-13-15: right elbow x-ray: No acute findings  06-13-15: right hand x-ray: No acute abnormalities.    LABS REVIEWED:   06-12-15: wbc 9.4; hgb 12.5; hct 36.3; mcv 94.5; plt 317; glucose 121; bun 15; creat 0.69; k+3.9; na++135; ast 72; albumin 2.4;  06-13-15: wbc 8.0; hgb 12.6; hct 37.1; mcv 95.1; plt 322; glucose 83; bun 12; creat 0.6; k+4.0; na++139; ast 88; albumin 2.2  10-15-15: wbc 8.2; hgb 13.1; hct 37.5; mcv 91.7; plt 142; glucose 179; bun 16; creat 0.80; k+ 4.0; na++134; ast 18; alt 54; alk phos 121; albumin 2.7  11-11-15: chol 219; ldl 152; trig 179; hdl 31  04-23-16: wbc 24.7; hgb 10.8; hct 30.7; mcv 89.8 ;plt 172; glucose 121; bun 65; creat 1.88; k+  4.3; na++132;        Review of Systems Unable to perform ROS: Dementia     Physical Exam Constitutional: No distress.  Thin   Eyes: Conjunctivae are normal.  Neck: Neck supple. No JVD present. No thyromegaly present.  Cardiovascular: Normal rate, regular rhythm and intact distal pulses.   Respiratory: Effort normal and breath sounds normal. No respiratory distress. He has no wheezes.  GI: Soft. Bowel sounds are normal. He exhibits no distension. There is no tenderness.  Musculoskeletal: He exhibits no edema.  Able to move all extremities   Lymphadenopathy:    He has no cervical adenopathy.  Neurological: not aware of surroundings  Skin: Skin is warm and dry. He is not diaphoretic.     ASSESSMENT/ PLAN:  Acute renal failure Altered mental status Leukocytosis   Will incsert picc line Will begin NS at 100 cc per hour for 2 liters Will begin zosyn 3.375 gm every 6 hours for 2 weeks Will begin vancomycin  500 mg very 8 hours for 2 weeks with pharmacy to dose Will check blood  culture X 2 site ua/c&s In Am will repeat cbc; and cmp    Time spent with patient 45   minutes >50% time spent counseling; reviewing medical record; tests; labs; and developing future plan of care     Ok Edwards NP [facility]  Contact [phone] Monday through Friday 8am- 5pm  After hours call [phone]

## 2016-04-24 ENCOUNTER — Inpatient Hospital Stay (HOSPITAL_COMMUNITY)
Admission: EM | Admit: 2016-04-24 | Discharge: 2016-05-02 | DRG: 871 | Disposition: A | Discharge status: Hospice | Payer: Medicare Other | Attending: Internal Medicine | Admitting: Internal Medicine

## 2016-04-24 ENCOUNTER — Encounter (HOSPITAL_COMMUNITY): Payer: Self-pay | Admitting: Emergency Medicine

## 2016-04-24 ENCOUNTER — Emergency Department (HOSPITAL_COMMUNITY): Payer: Medicare Other

## 2016-04-24 DIAGNOSIS — I10 Essential (primary) hypertension: Secondary | ICD-10-CM | POA: Diagnosis present

## 2016-04-24 DIAGNOSIS — Z7189 Other specified counseling: Secondary | ICD-10-CM | POA: Diagnosis not present

## 2016-04-24 DIAGNOSIS — Z66 Do not resuscitate: Secondary | ICD-10-CM | POA: Diagnosis not present

## 2016-04-24 DIAGNOSIS — D696 Thrombocytopenia, unspecified: Secondary | ICD-10-CM | POA: Diagnosis not present

## 2016-04-24 DIAGNOSIS — B1920 Unspecified viral hepatitis C without hepatic coma: Secondary | ICD-10-CM | POA: Diagnosis present

## 2016-04-24 DIAGNOSIS — J90 Pleural effusion, not elsewhere classified: Secondary | ICD-10-CM | POA: Diagnosis present

## 2016-04-24 DIAGNOSIS — E119 Type 2 diabetes mellitus without complications: Secondary | ICD-10-CM | POA: Diagnosis present

## 2016-04-24 DIAGNOSIS — E860 Dehydration: Secondary | ICD-10-CM | POA: Diagnosis present

## 2016-04-24 DIAGNOSIS — E118 Type 2 diabetes mellitus with unspecified complications: Secondary | ICD-10-CM | POA: Diagnosis present

## 2016-04-24 DIAGNOSIS — B182 Chronic viral hepatitis C: Secondary | ICD-10-CM

## 2016-04-24 DIAGNOSIS — A4151 Sepsis due to Escherichia coli [E. coli]: Secondary | ICD-10-CM | POA: Diagnosis not present

## 2016-04-24 DIAGNOSIS — D6959 Other secondary thrombocytopenia: Secondary | ICD-10-CM | POA: Diagnosis present

## 2016-04-24 DIAGNOSIS — N179 Acute kidney failure, unspecified: Secondary | ICD-10-CM | POA: Diagnosis present

## 2016-04-24 DIAGNOSIS — D72829 Elevated white blood cell count, unspecified: Secondary | ICD-10-CM | POA: Diagnosis present

## 2016-04-24 DIAGNOSIS — I959 Hypotension, unspecified: Secondary | ICD-10-CM | POA: Diagnosis present

## 2016-04-24 DIAGNOSIS — Z7902 Long term (current) use of antithrombotics/antiplatelets: Secondary | ICD-10-CM

## 2016-04-24 DIAGNOSIS — L8992 Pressure ulcer of unspecified site, stage 2: Secondary | ICD-10-CM | POA: Diagnosis present

## 2016-04-24 DIAGNOSIS — F0390 Unspecified dementia without behavioral disturbance: Secondary | ICD-10-CM | POA: Diagnosis not present

## 2016-04-24 DIAGNOSIS — N390 Urinary tract infection, site not specified: Secondary | ICD-10-CM | POA: Diagnosis present

## 2016-04-24 DIAGNOSIS — E876 Hypokalemia: Secondary | ICD-10-CM | POA: Diagnosis present

## 2016-04-24 DIAGNOSIS — R531 Weakness: Secondary | ICD-10-CM | POA: Diagnosis present

## 2016-04-24 DIAGNOSIS — Z79899 Other long term (current) drug therapy: Secondary | ICD-10-CM

## 2016-04-24 DIAGNOSIS — Y95 Nosocomial condition: Secondary | ICD-10-CM | POA: Diagnosis present

## 2016-04-24 DIAGNOSIS — Z515 Encounter for palliative care: Secondary | ICD-10-CM | POA: Diagnosis not present

## 2016-04-24 DIAGNOSIS — A419 Sepsis, unspecified organism: Secondary | ICD-10-CM | POA: Diagnosis not present

## 2016-04-24 DIAGNOSIS — E872 Acidosis: Secondary | ICD-10-CM | POA: Diagnosis present

## 2016-04-24 DIAGNOSIS — C220 Liver cell carcinoma: Secondary | ICD-10-CM | POA: Diagnosis not present

## 2016-04-24 DIAGNOSIS — J189 Pneumonia, unspecified organism: Secondary | ICD-10-CM | POA: Diagnosis present

## 2016-04-24 DIAGNOSIS — J690 Pneumonitis due to inhalation of food and vomit: Secondary | ICD-10-CM

## 2016-04-24 DIAGNOSIS — T17998D Other foreign object in respiratory tract, part unspecified causing other injury, subsequent encounter: Secondary | ICD-10-CM | POA: Diagnosis not present

## 2016-04-24 DIAGNOSIS — I81 Portal vein thrombosis: Secondary | ICD-10-CM | POA: Diagnosis present

## 2016-04-24 DIAGNOSIS — T17908A Unspecified foreign body in respiratory tract, part unspecified causing other injury, initial encounter: Secondary | ICD-10-CM | POA: Insufficient documentation

## 2016-04-24 DIAGNOSIS — R7881 Bacteremia: Secondary | ICD-10-CM | POA: Diagnosis not present

## 2016-04-24 DIAGNOSIS — R918 Other nonspecific abnormal finding of lung field: Secondary | ICD-10-CM

## 2016-04-24 DIAGNOSIS — L8990 Pressure ulcer of unspecified site, unspecified stage: Secondary | ICD-10-CM | POA: Insufficient documentation

## 2016-04-24 LAB — COMPREHENSIVE METABOLIC PANEL
ALBUMIN: 2.1 g/dL — AB (ref 3.5–5.0)
ALK PHOS: 271 U/L — AB (ref 38–126)
ALT: 53 U/L (ref 17–63)
AST: 181 U/L — AB (ref 15–41)
Anion gap: 12 (ref 5–15)
BUN: 75 mg/dL — ABNORMAL HIGH (ref 6–20)
CO2: 14 mmol/L — AB (ref 22–32)
Calcium: 8.7 mg/dL — ABNORMAL LOW (ref 8.9–10.3)
Chloride: 110 mmol/L (ref 101–111)
Creatinine, Ser: 2.05 mg/dL — ABNORMAL HIGH (ref 0.61–1.24)
GFR calc Af Amer: 33 mL/min — ABNORMAL LOW (ref >60)
GFR calc non Af Amer: 28 mL/min — ABNORMAL LOW (ref >60)
GLUCOSE: 188 mg/dL — AB (ref 65–99)
Potassium: 4.5 mmol/L (ref 3.5–5.1)
SODIUM: 136 mmol/L (ref 135–145)
Total Bilirubin: 2 mg/dL — ABNORMAL HIGH (ref 0.3–1.2)
Total Protein: 6.4 g/dL — ABNORMAL LOW (ref 6.5–8.1)

## 2016-04-24 LAB — URINE MICROSCOPIC-ADD ON

## 2016-04-24 LAB — CBC WITH DIFFERENTIAL/PLATELET
Basophils Absolute: 0 10*3/uL (ref 0.0–0.1)
Basophils Relative: 0 %
Eosinophils Absolute: 0 10*3/uL (ref 0.0–0.7)
Eosinophils Relative: 0 %
HEMATOCRIT: 31.4 % — AB (ref 39.0–52.0)
Hemoglobin: 11.3 g/dL — ABNORMAL LOW (ref 13.0–17.0)
Lymphocytes Relative: 6 %
Lymphs Abs: 1.5 10*3/uL (ref 0.7–4.0)
MCH: 31.7 pg (ref 26.0–34.0)
MCHC: 36 g/dL (ref 30.0–36.0)
MCV: 88 fL (ref 78.0–100.0)
MONO ABS: 1.5 10*3/uL — AB (ref 0.1–1.0)
MONOS PCT: 6 %
NEUTROS PCT: 88 %
Neutro Abs: 21.6 10*3/uL — ABNORMAL HIGH (ref 1.7–7.7)
Platelets: 126 10*3/uL — ABNORMAL LOW (ref 150–400)
RBC: 3.57 MIL/uL — AB (ref 4.22–5.81)
RDW: 15.2 % (ref 11.5–15.5)
WBC: 24.6 10*3/uL — AB (ref 4.0–10.5)

## 2016-04-24 LAB — URINALYSIS, ROUTINE W REFLEX MICROSCOPIC
BILIRUBIN URINE: NEGATIVE
GLUCOSE, UA: NEGATIVE mg/dL
Ketones, ur: NEGATIVE mg/dL
Nitrite: NEGATIVE
PROTEIN: NEGATIVE mg/dL
SPECIFIC GRAVITY, URINE: 1.018 (ref 1.005–1.030)
pH: 6 (ref 5.0–8.0)

## 2016-04-24 LAB — LACTIC ACID, PLASMA: Lactic Acid, Venous: 3.1 mmol/L (ref 0.5–2.0)

## 2016-04-24 LAB — I-STAT CG4 LACTIC ACID, ED: LACTIC ACID, VENOUS: 1.83 mmol/L (ref 0.5–2.0)

## 2016-04-24 LAB — LIPASE, BLOOD: Lipase: 85 U/L — ABNORMAL HIGH (ref 11–51)

## 2016-04-24 LAB — CBG MONITORING, ED: GLUCOSE-CAPILLARY: 171 mg/dL — AB (ref 65–99)

## 2016-04-24 MED ORDER — ONDANSETRON HCL 4 MG PO TABS: 4.0000 mg | ORAL_TABLET | Freq: Four times a day (QID) | ORAL | Status: DC | PRN

## 2016-04-24 MED ORDER — AMLODIPINE BESYLATE 10 MG PO TABS
10.0000 mg | ORAL_TABLET | Freq: Every day | ORAL | Status: DC
  Filled 2016-04-24: qty 1

## 2016-04-24 MED ORDER — ALBUTEROL SULFATE (2.5 MG/3ML) 0.083% IN NEBU
2.5000 mg | INHALATION_SOLUTION | Freq: Four times a day (QID) | RESPIRATORY_TRACT | Status: DC
Start: 2016-04-24 — End: 2016-04-25

## 2016-04-24 MED ORDER — SODIUM CHLORIDE 0.9 % IV SOLN
INTRAVENOUS | Status: DC
  Administered 2016-04-24: via INTRAVENOUS

## 2016-04-24 MED ORDER — ENOXAPARIN SODIUM 30 MG/0.3ML ~~LOC~~ SOLN
30.0000 mg | Freq: Every day | SUBCUTANEOUS | Status: DC
  Administered 2016-04-24 – 2016-04-26 (×3): 30 mg via SUBCUTANEOUS
  Filled 2016-04-24 (×4): qty 0.3

## 2016-04-24 MED ORDER — VANCOMYCIN HCL IN DEXTROSE 1-5 GM/200ML-% IV SOLN
1000.0000 mg | Freq: Once | INTRAVENOUS | Status: AC
  Administered 2016-04-24: 1000 mg via INTRAVENOUS
  Filled 2016-04-24: qty 200

## 2016-04-24 MED ORDER — POLYETHYLENE GLYCOL 3350 17 GM/SCOOP PO POWD
1.0000 | Freq: Every day | ORAL | Status: DC
  Filled 2016-04-24: qty 255

## 2016-04-24 MED ORDER — INSULIN ASPART 100 UNIT/ML ~~LOC~~ SOLN: 0.0000 [IU] | Freq: Every day | SUBCUTANEOUS | Status: DC

## 2016-04-24 MED ORDER — CEFEPIME HCL 2 G IJ SOLR
2.0000 g | Freq: Once | INTRAMUSCULAR | Status: AC
  Administered 2016-04-24: 2 g via INTRAVENOUS
  Filled 2016-04-24: qty 2

## 2016-04-24 MED ORDER — ACETAMINOPHEN 325 MG PO TABS: 650.0000 mg | ORAL_TABLET | Freq: Three times a day (TID) | ORAL | Status: DC | PRN

## 2016-04-24 MED ORDER — SODIUM CHLORIDE 0.9 % IV BOLUS (SEPSIS)
1000.0000 mL | Freq: Once | INTRAVENOUS | Status: AC
  Administered 2016-04-24: 1000 mL via INTRAVENOUS

## 2016-04-24 MED ORDER — POTASSIUM CHLORIDE CRYS ER 20 MEQ PO TBCR
20.0000 meq | EXTENDED_RELEASE_TABLET | Freq: Every day | ORAL | Status: DC
  Administered 2016-04-25: 20 meq via ORAL
  Filled 2016-04-24 (×2): qty 1

## 2016-04-24 MED ORDER — POLYETHYLENE GLYCOL 3350 17 G PO PACK
17.0000 g | PACK | Freq: Every day | ORAL | Status: DC
  Administered 2016-04-25: 17 g via ORAL
  Filled 2016-04-24 (×2): qty 1

## 2016-04-24 MED ORDER — OXYCODONE HCL 5 MG PO TABS: 5.0000 mg | ORAL_TABLET | ORAL | Status: DC | PRN

## 2016-04-24 MED ORDER — ALBUTEROL SULFATE (2.5 MG/3ML) 0.083% IN NEBU: 2.5000 mg | INHALATION_SOLUTION | RESPIRATORY_TRACT | Status: DC | PRN

## 2016-04-24 MED ORDER — DEXTROSE 5 % IV SOLN
1.0000 g | INTRAVENOUS | Status: DC
  Filled 2016-04-24: qty 1

## 2016-04-24 MED ORDER — ONDANSETRON HCL 4 MG/2ML IJ SOLN: 4.0000 mg | Freq: Four times a day (QID) | INTRAMUSCULAR | Status: DC | PRN

## 2016-04-24 MED ORDER — POTASSIUM CHLORIDE ER 20 MEQ PO TBCR: 20.0000 meq | EXTENDED_RELEASE_TABLET | Freq: Every day | ORAL | Status: DC

## 2016-04-24 MED ORDER — GABAPENTIN 300 MG PO CAPS
300.0000 mg | ORAL_CAPSULE | Freq: Two times a day (BID) | ORAL | Status: DC
  Administered 2016-04-24 – 2016-04-25 (×2): 300 mg via ORAL
  Filled 2016-04-24 (×5): qty 1

## 2016-04-24 MED ORDER — INSULIN ASPART 100 UNIT/ML ~~LOC~~ SOLN: 0.0000 [IU] | Freq: Three times a day (TID) | SUBCUTANEOUS | Status: DC

## 2016-04-24 MED ORDER — DONEPEZIL HCL 10 MG PO TABS
10.0000 mg | ORAL_TABLET | Freq: Every day | ORAL | Status: DC
  Administered 2016-04-24: 10 mg via ORAL
  Filled 2016-04-24 (×3): qty 1

## 2016-04-24 MED ORDER — CLOPIDOGREL BISULFATE 75 MG PO TABS
75.0000 mg | ORAL_TABLET | Freq: Every day | ORAL | Status: DC
  Administered 2016-04-25: 75 mg via ORAL
  Filled 2016-04-24 (×3): qty 1

## 2016-04-24 MED ORDER — VANCOMYCIN HCL IN DEXTROSE 750-5 MG/150ML-% IV SOLN: 750.0000 mg | INTRAVENOUS | Status: DC

## 2016-04-24 MED ORDER — ACETAMINOPHEN 650 MG RE SUPP: 650.0000 mg | Freq: Four times a day (QID) | RECTAL | Status: DC | PRN

## 2016-04-24 MED ORDER — SERTRALINE HCL 50 MG PO TABS
75.0000 mg | ORAL_TABLET | Freq: Every day | ORAL | Status: DC
  Administered 2016-04-25: 75 mg via ORAL
  Filled 2016-04-24 (×2): qty 1

## 2016-04-24 MED ORDER — HYDROMORPHONE HCL 1 MG/ML IJ SOLN: 0.5000 mg | INTRAMUSCULAR | Status: DC | PRN

## 2016-04-24 NOTE — ED Notes (Signed)
[facility] presents with a 82 yo male from [facility] with abnormal labs, generalized body aches and a possible lung infection per the facility in which he came.  Per [facility] staff reported pt has been on vancomycin for chronic elevated WBCs and has chronic generalized body aches.  Per facility staff pt has been afebrile yet they performed a chest xray in which gave them indication of possible lung infection.  CBG 250 mg/dl.  Additionally, RDW was elevated per staff.

## 2016-04-24 NOTE — Progress Notes (Signed)
Pharmacy Antibiotic Note  [name] is a 82 y.o. male admitted on 04/24/2016 with pneumonia and sepsis.  Here from [facility] after becoming progressively more lethargic over past week.  Started on Vanc/Zosyn at facility on 5/4 for leukocytosis despite unremarkable CXR.  WBC worse on 5/5 and brought to ED. CXR today shows new RML infiltrate.  Pharmacy has been consulted for vancomycin and cefepime dosing.  New AKI noted; LA elevated  Plan:  Vancomycin 1000 mg IV now, then 750 mg IV q24 hr; goal trough 15-20 mcg/mL  Measure vancomycin trough levels at steady state as indicated  Cefepime 2g IV now, then 1 g IV q24 hr  Follow clinical course, renal function, culture results as available  Follow for de-escalation of antibiotics and LOT     Temp (24hrs), Avg:98.3 F (36.8 C), Min:98 F (36.7 C), Max:98.6 F (37 C)   Recent Labs Lab 04/24/16 1851 04/24/16 1956  WBC 24.6*  --   CREATININE 2.05*  --   LATICACIDVEN  --  3.1*    Estimated Creatinine Clearance: 22.8 mL/min (by C-G formula based on Cr of 2.05).    No Known Allergies  Antimicrobials this admission: vancomycin 5/5 >>  cefepime 5/5 >>   Dose adjustments this admission: ---  Microbiology results: 5/5 BCx: sent 5/5 UCx: sent   Thank you for allowing pharmacy to be a part of this patient's care.  Reuel Boom, PharmD, BCPS Pager: [phone] 04/24/2016, 9:30 PM

## 2016-04-24 NOTE — ED Notes (Signed)
Bed: [facility] Expected date:  Expected time:  Means of arrival:  Comments: EMS-abnormal labs
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
## 2016-04-24 NOTE — ED Notes (Signed)
Bed: [facility]06 Expected date:  Expected time:  Means of arrival:  Comments: [place] [facility]

## 2016-04-24 NOTE — ED Notes (Signed)
Patient transported to X-ray 

## 2016-04-24 NOTE — ED Provider Notes (Signed)
Medical screening examination/treatment/procedure(s) were conducted as a shared visit with non-physician practitioner(s) and myself.  I personally evaluated the patient during the encounter.   EKG Interpretation None     Patient here from nursing home after being treated for possible infection there with IV antibiotics. Patient's had leukocytosis for which they are unsure of the source. Those records were reviewed as well as his chest x-ray here which shows pneumonia. Lactate elevated. Will start with IV fluids as well as antibiotics. Patient to be admitted to the medical service  Lacretia Leigh, MD 04/24/16 2041

## 2016-04-24 NOTE — ED Provider Notes (Signed)
CSN: [id number]     Arrival date & time 04/24/16  1715 History   First MD Initiated Contact with Patient 04/24/16 1727     Chief Complaint  Patient presents with  . Abnormal Lab  . possible lung infection   . generalized pain      (Consider location/radiation/quality/duration/timing/severity/associated sxs/prior Treatment) HPI Comments: Jeffery Rhodes is a 82 y.o. Male with history of liver cancer arrived via EMS from [place] for altered mental status. Patient is alert to self and place. Unable to provide a history. Talked with Jackelyn Poling at [facility] who reports patient have become progressively more lethargic and weak over the last week. Cognitively declined yesterday into today.  Labs completed on 04/23/16 showed elevated WBC at 24K. Patient was given two doses Vancomycin and Zosyn as well as fluids. Repeat WBC 04/24/16 showed an increasing WBC of 31K. Chest x-ray on 04/23/16 negative for acute cardiopulmonary process.   Level V caveat   The history is provided by the nursing home. The history is limited by the condition of the patient.    Past Medical History  Diagnosis Date  . Dementia   . Cancer ([place])   . Liver cancer ([place])   . Hepatitis C    Past Surgical History  Procedure Laterality Date  . Hip surgery Left    History reviewed. No pertinent family history. Social History  Substance Use Topics  . Smoking status: Never Smoker   . Smokeless tobacco: None  . Alcohol Use: No    Review of Systems  Unable to perform ROS: Mental status change      Allergies  Review of patient's allergies indicates no known allergies.  Home Medications   Prior to Admission medications   Medication Sig Start Date End Date Taking? Authorizing Provider  amLODipine (NORVASC) 5 MG tablet Take 10 mg by mouth daily.    Yes Historical Provider, MD  clopidogrel (PLAVIX) 75 MG tablet Take 75 mg by mouth daily. 12/11/14  Yes Historical Provider, MD  donepezil (ARICEPT) 10 MG tablet Take 10  mg by mouth at bedtime.   Yes Historical Provider, MD  gabapentin (NEURONTIN) 300 MG capsule Take 300 mg by mouth 2 (two) times daily.  11/14/14  Yes Historical Provider, MD  oxyCODONE (ROXICODONE) 5 MG immediate release tablet Take one tablet by mouth every 4 hours as needed for pain 08/19/15  Yes Tiffany L Reed, DO  piperacillin-tazobactam (ZOSYN) 3.375 (3-0.375) g injection Inject 3.375 g into the muscle every 6 (six) hours.   Yes Historical Provider, MD  polyethylene glycol powder (GLYCOLAX/MIRALAX) powder Take 17 g by mouth daily.  11/14/14  Yes Historical Provider, MD  Potassium Chloride ER 20 MEQ TBCR Take 20 mEq by mouth daily.   Yes Historical Provider, MD  sertraline (ZOLOFT) 50 MG tablet Take 75 mg by mouth daily.   Yes Historical Provider, MD  vancomycin 500 mg in sodium chloride 0.9 % 100 mL Inject 500 mg into the vein every 8 (eight) hours.   Yes Historical Provider, MD   BP 126/62 mmHg  Pulse 80  Temp(Src) 98.6 F (37 C) (Rectal)  Resp 14  SpO2 97% Physical Exam  Constitutional: No distress.  Patient appears frail.   HENT:  Head: Normocephalic and atraumatic.  Mouth/Throat: Mucous membranes are dry. No oropharyngeal exudate.  Eyes: Conjunctivae are normal. Pupils are equal, round, and reactive to light. Right eye exhibits no discharge. Left eye exhibits no discharge. No scleral icterus.  Neck: Neck supple.  Cardiovascular: Normal rate, regular rhythm and intact distal pulses.   1+ lower extremity swelling b/l a Pulmonary/Chest: Effort normal. No respiratory distress.  Diffuse coarse breath sounds.   Abdominal: Soft. Bowel sounds are normal. There is generalized tenderness.  Lymphadenopathy:    He has no cervical adenopathy.  Neurological:  Patient is oriented to self and place. Difficult to obtain full neurologic exam secondary to patient mental status. He is able to move toes and fingers and squeeze fingers on command.   Skin: Skin is warm and dry. He is not  diaphoretic.  Psychiatric:  Patient is lethargic, but arousable.     ED Course  Procedures (including critical care time) Labs Review Labs Reviewed  CBC WITH DIFFERENTIAL/PLATELET - Abnormal; Notable for the following:    WBC 24.6 (*)    RBC 3.57 (*)    Hemoglobin 11.3 (*)    HCT 31.4 (*)    Platelets 126 (*)    Neutro Abs 21.6 (*)    Monocytes Absolute 1.5 (*)    All other components within normal limits  COMPREHENSIVE METABOLIC PANEL - Abnormal; Notable for the following:    CO2 14 (*)    Glucose, Bld 188 (*)    BUN 75 (*)    Creatinine, Ser 2.05 (*)    Calcium 8.7 (*)    Total Protein 6.4 (*)    Albumin 2.1 (*)    AST 181 (*)    Alkaline Phosphatase 271 (*)    Total Bilirubin 2.0 (*)    GFR calc non Af Amer 28 (*)    GFR calc Af Amer 33 (*)    All other components within normal limits  LIPASE, BLOOD - Abnormal; Notable for the following:    Lipase 85 (*)    All other components within normal limits  LACTIC ACID, PLASMA - Abnormal; Notable for the following:    Lactic Acid, Venous 3.1 (*)    All other components within normal limits  URINALYSIS, ROUTINE W REFLEX MICROSCOPIC (NOT AT [facility]) - Abnormal; Notable for the following:    Color, Urine AMBER (*)    APPearance CLOUDY (*)    Hgb urine dipstick MODERATE (*)    Leukocytes, UA LARGE (*)    All other components within normal limits  URINE MICROSCOPIC-ADD ON - Abnormal; Notable for the following:    Squamous Epithelial / LPF 0-5 (*)    Bacteria, UA RARE (*)    All other components within normal limits  CBG MONITORING, ED - Abnormal; Notable for the following:    Glucose-Capillary 171 (*)    All other components within normal limits  CULTURE, BLOOD (ROUTINE X 2)  CULTURE, BLOOD (ROUTINE X 2)  URINE CULTURE  LACTIC ACID, PLASMA  I-STAT CG4 LACTIC ACID, ED  I-STAT CG4 LACTIC ACID, ED    Imaging Review Dg Chest 2 View  04/24/2016  CLINICAL DATA:  Possible pneumonia. EXAM: CHEST  2 VIEW COMPARISON:  June 12, 2015 FINDINGS: New infiltrate is seen in the right mid lung peripherally most consistent with pneumonia. Recommend follow-up to resolution. A right PICC line is in good position. Buckshot material projects over the chest. No other interval changes or acute abnormalities. IMPRESSION: Right-sided pneumonia.  Recommend follow-up to resolution. Electronically Signed   By: Dorise Bullion III M.D   On: 04/24/2016 19:34   I have personally reviewed and evaluated these images and lab results as part of my medical decision-making.   EKG Interpretation None  MDM   Final diagnoses:  Healthcare-associated pneumonia    Patient comes via EMS from [facility] to ED for altered mental status. [place] obtained labs showing an elevated WBC and acute kidney injury of unknown source; he received Vancomycin, Zosyn, and fluids at facility with no improvement. Patient is lethargic, but arousable, can follow some simple commands. Adventitious breath sounds on exam. Patient discussed with Dr. Zenia Resides. Rectal temp 98.8F. Lactic acid elevated at 3.1. WBC 24.6K. Elevated lipase, AST, ALK phos. Acute kidney injury at 2.05. UA positive for hemoglobin, leukocytes, and WBC. CXR shows right sided pneumonia. Patient meets criteria for sepsis.  IV fluids and ABX initiated. Blood and urine cultures ordered. Dr. Zenia Resides consulted [facility]. Patient admitted for further management.    Roxanna Mew, [place] 04/24/16 2225

## 2016-04-25 DIAGNOSIS — L8990 Pressure ulcer of unspecified site, unspecified stage: Secondary | ICD-10-CM

## 2016-04-25 LAB — CBC
HEMATOCRIT: 27.1 % — AB (ref 39.0–52.0)
HEMOGLOBIN: 9.8 g/dL — AB (ref 13.0–17.0)
MCH: 31.6 pg (ref 26.0–34.0)
MCHC: 36.2 g/dL — AB (ref 30.0–36.0)
MCV: 87.4 fL (ref 78.0–100.0)
Platelets: 117 10*3/uL — ABNORMAL LOW (ref 150–400)
RBC: 3.1 MIL/uL — ABNORMAL LOW (ref 4.22–5.81)
RDW: 15.5 % (ref 11.5–15.5)
WBC: 23.8 10*3/uL — ABNORMAL HIGH (ref 4.0–10.5)

## 2016-04-25 LAB — BLOOD CULTURE ID PANEL (REFLEXED)
ACINETOBACTER BAUMANNII: NOT DETECTED
CANDIDA ALBICANS: NOT DETECTED
CANDIDA PARAPSILOSIS: NOT DETECTED
CARBAPENEM RESISTANCE: NOT DETECTED
Candida glabrata: NOT DETECTED
Candida krusei: NOT DETECTED
Candida tropicalis: NOT DETECTED
ENTEROBACTER CLOACAE COMPLEX: NOT DETECTED
ENTEROBACTERIACEAE SPECIES: NOT DETECTED
ENTEROCOCCUS SPECIES: NOT DETECTED
Escherichia coli: DETECTED — AB
Haemophilus influenzae: NOT DETECTED
KLEBSIELLA OXYTOCA: NOT DETECTED
KLEBSIELLA PNEUMONIAE: NOT DETECTED
Listeria monocytogenes: NOT DETECTED
Methicillin resistance: NOT DETECTED
NEISSERIA MENINGITIDIS: NOT DETECTED
PSEUDOMONAS AERUGINOSA: NOT DETECTED
Proteus species: NOT DETECTED
STAPHYLOCOCCUS AUREUS BCID: NOT DETECTED
STAPHYLOCOCCUS SPECIES: NOT DETECTED
STREPTOCOCCUS AGALACTIAE: NOT DETECTED
STREPTOCOCCUS PNEUMONIAE: NOT DETECTED
STREPTOCOCCUS SPECIES: NOT DETECTED
Serratia marcescens: NOT DETECTED
Streptococcus pyogenes: NOT DETECTED
VANCOMYCIN RESISTANCE: NOT DETECTED

## 2016-04-25 LAB — GLUCOSE, CAPILLARY
GLUCOSE-CAPILLARY: 144 mg/dL — AB (ref 65–99)
GLUCOSE-CAPILLARY: 93 mg/dL (ref 65–99)
Glucose-Capillary: 101 mg/dL — ABNORMAL HIGH (ref 65–99)
Glucose-Capillary: 79 mg/dL (ref 65–99)
Glucose-Capillary: 83 mg/dL (ref 65–99)

## 2016-04-25 LAB — STREP PNEUMONIAE URINARY ANTIGEN: Strep Pneumo Urinary Antigen: NEGATIVE

## 2016-04-25 LAB — MRSA PCR SCREENING: MRSA by PCR: NEGATIVE

## 2016-04-25 LAB — BASIC METABOLIC PANEL
Anion gap: 6 (ref 5–15)
BUN: 63 mg/dL — ABNORMAL HIGH (ref 6–20)
CO2: 15 mmol/L — AB (ref 22–32)
Calcium: 7.6 mg/dL — ABNORMAL LOW (ref 8.9–10.3)
Chloride: 116 mmol/L — ABNORMAL HIGH (ref 101–111)
Creatinine, Ser: 1.51 mg/dL — ABNORMAL HIGH (ref 0.61–1.24)
GFR calc Af Amer: 47 mL/min — ABNORMAL LOW (ref >60)
GFR, EST NON AFRICAN AMERICAN: 41 mL/min — AB (ref >60)
GLUCOSE: 122 mg/dL — AB (ref 65–99)
POTASSIUM: 3.4 mmol/L — AB (ref 3.5–5.1)
SODIUM: 137 mmol/L (ref 135–145)

## 2016-04-25 LAB — MAGNESIUM: Magnesium: 1.7 mg/dL (ref 1.7–2.4)

## 2016-04-25 LAB — LACTIC ACID, PLASMA: LACTIC ACID, VENOUS: 1.4 mmol/L (ref 0.5–2.0)

## 2016-04-25 MED ORDER — SODIUM CHLORIDE 0.9% FLUSH
10.0000 mL | INTRAVENOUS | Status: DC | PRN
  Administered 2016-04-26 – 2016-04-30 (×5): 10 mL
  Filled 2016-04-25 (×5): qty 40

## 2016-04-25 MED ORDER — SODIUM CHLORIDE 0.9 % IV BOLUS (SEPSIS)
500.0000 mL | Freq: Once | INTRAVENOUS | Status: AC
  Administered 2016-04-25: 500 mL via INTRAVENOUS

## 2016-04-25 MED ORDER — ALBUTEROL SULFATE (2.5 MG/3ML) 0.083% IN NEBU: 2.5000 mg | INHALATION_SOLUTION | Freq: Three times a day (TID) | RESPIRATORY_TRACT | Status: DC

## 2016-04-25 MED ORDER — DEXTROSE 5 % IV SOLN
1.0000 g | Freq: Two times a day (BID) | INTRAVENOUS | Status: DC
  Administered 2016-04-25 – 2016-04-27 (×4): 1 g via INTRAVENOUS
  Filled 2016-04-25 (×5): qty 1

## 2016-04-25 NOTE — Progress Notes (Signed)
Radiology report stated PICC line was in good position, further evaluation called Radiologist and they verbally confirmed PICC line tip was in [place], stated okay to use PICC line.  [facility] Team.

## 2016-04-25 NOTE — Progress Notes (Signed)
PROGRESS NOTE    Jeffery Rhodes  [id number] DOB: [birth year] DOA: 04/24/2016  PCP: Gildardo Cranker, DO  Outpatient Specialists:     Brief Narrative:  Jeffery Rhodes is a 82 y.o. male with a history of Hepatocellular Cancer, Hepatitis C, HTN, DM2 And Dementia who was sent from the [facility] to the ED due to increased weakness and decline over the past 2-3 days.History about what led to his admission is not available but in the ER he is found to have leukocytosis, acute thrombocytopenia, lactic acidosis, RLL lung infiltrate, UTI and AKI.  Subjective: Does not answer questions. States he does not want to eat.   Assessment & Plan:   Principal Problem:   HCAP  / UTI/  Leukocytosis/ lactic acidosis - U strep neg - suspect aspiration- this is further reinforced by the fact that he choked on his crushed meds this AM - SLP eval- Cefepime- d/c Vanc and MRSA PCR negative - f/u cultures  Active Problems: AKI - baseline Cr 0.8>>  2.0 on admission - likely dehydration- improving with IVF    Hepatocellular carcinoma /  Hepatitis C - has not been receiving treatment     Dementia   - late stages- Aricept    Essential hypertension, benign - hypotensive- hold Norvasc  DM 2 - per NH notes last A1c 7.7 - has not been on medications    Thrombocytopenia - due to acute infection?    Pressure ulcer - per nursing  DVT prophylaxis: Lovenox Code Status: full code Family Communication:  Disposition Plan:  Return to SNF Consultants:   none Procedures:   none Antimicrobials:  Anti-infectives    Start     Dose/Rate Route Frequency Ordered Stop   04/25/16 2200  vancomycin (VANCOCIN) IVPB 750 mg/150 ml premix  Status:  Discontinued     750 mg 150 mL/hr over 60 Minutes Intravenous Every 24 hours 04/24/16 2126 04/25/16 0913   04/25/16 2100  ceFEPIme (MAXIPIME) 1 g in dextrose 5 % 50 mL IVPB     1 g 100 mL/hr over 30 Minutes Intravenous Every 24 hours 04/24/16 2126      04/24/16 2045  ceFEPIme (MAXIPIME) 2 g in dextrose 5 % 50 mL IVPB     2 g 100 mL/hr over 30 Minutes Intravenous  Once 04/24/16 2039 04/24/16 2150   04/24/16 2045  vancomycin (VANCOCIN) IVPB 1000 mg/200 mL premix     1,000 mg 200 mL/hr over 60 Minutes Intravenous  Once 04/24/16 2039 04/24/16 2309       Objective: Filed Vitals:   04/25/16 0037 04/25/16 0158 04/25/16 0507 04/25/16 1013  BP: 98/56 127/72 105/73 131/56  Pulse: 79 77 87 90  Temp: 97.8 F (36.6 C) 97.6 F (36.4 C) 97.7 F (36.5 C) 97.9 F (36.6 C)  TempSrc: Axillary Axillary Axillary Axillary  Resp: 20 18 18    SpO2: 100% 92% 100% 100%    Intake/Output Summary (Last 24 hours) at 04/25/16 1304 Last data filed at 04/25/16 0800  Gross per 24 hour  Intake     60 ml  Output    280 ml  Net   -220 ml   There were no vitals filed for this visit.  Examination: General exam: Alert - Appears comfortable  HEENT: PERRLA, oral mucosa moist, food sitting in his mouth,  no sclera icterus or thrush Respiratory system: Clear to auscultation. Respiratory effort normal. Cardiovascular system: S1 & S2 heard, RRR.  No murmurs  Gastrointestinal system: Abdomen soft,  non-tender, nondistended. Normal bowel sound. No organomegaly Central nervous system: Alert - cannot tell if he is oriented-  No focal neurological deficits. Extremities: No cyanosis, clubbing or edema Skin: No rashes or ulcers      Data Reviewed: I have personally reviewed following labs and imaging studies  CBC:  Recent Labs Lab 04/24/16 1851 04/25/16 0330  WBC 24.6* 23.8*  NEUTROABS 21.6*  --   HGB 11.3* 9.8*  HCT 31.4* 27.1*  MCV 88.0 87.4  PLT 126* 123XX123*   Basic Metabolic Panel:  Recent Labs Lab 04/24/16 1851 04/25/16 0330  NA 136 137  K 4.5 3.4*  CL 110 116*  CO2 14* 15*  GLUCOSE 188* 122*  BUN 75* 63*  CREATININE 2.05* 1.51*  CALCIUM 8.7* 7.6*  MG  --  1.7   GFR: Estimated Creatinine Clearance: 31 mL/min (by C-G formula based on Cr  of 1.51). Liver Function Tests:  Recent Labs Lab 04/24/16 1851  AST 181*  ALT 53  ALKPHOS 271*  BILITOT 2.0*  PROT 6.4*  ALBUMIN 2.1*    Recent Labs Lab 04/24/16 1851  LIPASE 85*   No results for input(s): AMMONIA in the last 168 hours. Coagulation Profile: No results for input(s): INR, PROTIME in the last 168 hours. Cardiac Enzymes: No results for input(s): CKTOTAL, CKMB, CKMBINDEX, TROPONINI in the last 168 hours. BNP (last 3 results) No results for input(s): PROBNP in the last 8760 hours. HbA1C: No results for input(s): HGBA1C in the last 72 hours. CBG:  Recent Labs Lab 04/24/16 1900 04/25/16 0001 04/25/16 0745 04/25/16 1133  GLUCAP 171* 144* 101* 93   Lipid Profile: No results for input(s): CHOL, HDL, LDLCALC, TRIG, CHOLHDL, LDLDIRECT in the last 72 hours. Thyroid Function Tests: No results for input(s): TSH, T4TOTAL, FREET4, T3FREE, THYROIDAB in the last 72 hours. Anemia Panel: No results for input(s): VITAMINB12, FOLATE, FERRITIN, TIBC, IRON, RETICCTPCT in the last 72 hours. Urine analysis:    Component Value Date/Time   COLORURINE AMBER* 04/24/2016 2050   APPEARANCEUR CLOUDY* 04/24/2016 2050   LABSPEC 1.018 04/24/2016 2050   PHURINE 6.0 04/24/2016 2050   GLUCOSEU NEGATIVE 04/24/2016 2050   HGBUR MODERATE* 04/24/2016 2050   [place] NEGATIVE 04/24/2016 2050   [place] 04/24/2016 2050   PROTEINUR NEGATIVE 04/24/2016 2050   UROBILINOGEN 4.0* 06/12/2015 0800   NITRITE NEGATIVE 04/24/2016 2050   LEUKOCYTESUR LARGE* 04/24/2016 2050   Sepsis Labs: @LABRCNTIP (procalcitonin:4,lacticidven:4)  ) Recent Results (from the past 240 hour(s))  MRSA PCR Screening     Status: None   Collection Time: 04/25/16  3:03 AM  Result Value Ref Range Status   MRSA by PCR NEGATIVE NEGATIVE Final    Comment:        The GeneXpert MRSA Assay (FDA approved for NASAL specimens only), is one component of a comprehensive MRSA colonization surveillance  program. It is not intended to diagnose MRSA infection nor to guide or monitor treatment for MRSA infections.          Radiology Studies: Dg Chest 2 View  04/24/2016  CLINICAL DATA:  Possible pneumonia. EXAM: CHEST  2 VIEW COMPARISON:  June 12, 2015 FINDINGS: New infiltrate is seen in the right mid lung peripherally most consistent with pneumonia. Recommend follow-up to resolution. A right PICC line is in good position. Buckshot material projects over the chest. No other interval changes or acute abnormalities. IMPRESSION: Right-sided pneumonia.  Recommend follow-up to resolution. Electronically Signed   By: Dorise Bullion III M.D   On: 04/24/2016 19:34  Scheduled Meds: . ceFEPime (MAXIPIME) IV  1 g Intravenous Q24H  . clopidogrel  75 mg Oral Q breakfast  . donepezil  10 mg Oral QHS  . enoxaparin (LOVENOX) injection  30 mg Subcutaneous QHS  . gabapentin  300 mg Oral BID  . polyethylene glycol  17 g Oral Daily  . potassium chloride  20 mEq Oral Daily  . sertraline  75 mg Oral Daily   Continuous Infusions: . sodium chloride 75 mL/hr at 04/24/16 2353     LOS: 1 day    Time spent in minutes: 11    [place], MD [facility] Pager: [email or web address] Password [email or web address] 04/25/2016, 1:04 PM

## 2016-04-25 NOTE — Progress Notes (Signed)
PHARMACY - PHYSICIAN COMMUNICATION CRITICAL VALUE ALERT - BLOOD CULTURE IDENTIFICATION (BCID)  BCID result received:  [ ]  Vancomycin resistant enterococcus (VRE) [ ]  Enterococcus spp (no resistance detected) [ ]  Listeria monocytogenes [ ]  Staphylococcus species (methicillin resistance detected) [ ]  Staphylococcus species (methicllin resistance NOT detected) [ ]  Methicillin-resistant Staphylococcus aureus (MRSA) [ ]  Methicillin-susceptible Staphylococcus aureus (MSSA) [ ]  Streptococcus agalactiae (Group B strep) [ ]  Streptococcus pneumoniae [ ]  Streptococcus pyogenes (Group A strep) [ ]  Streptococcus spp.  [ ]  Acinetobacter baumannii [ ]  Enterobacter cloacae [ ]  Enterobacter cloacae (Carbapenem resistance detected) Valu.Nieves ] Escherichia coli [ ]  Escherichia coli (Carbapenem resistance detected) [ ]  Haemophilus influenza [ ]  Klebsiella oxytoca [ ]  Klebsiella oxytoca (Carbapenem resistance detected) [ ]  Klebsiella pneumoniae [ ]  Klebsiella pneumoniae (Carbapenem resistance detected) [ ]  Neisseria meningitidis [ ]  Proteus spp. [ ]  Proteus spp. (Carbapenem resistance detected) [ ]  Pseudomonas aeruginosa [ ]  Pseudomonas aeruginosa (Carbapenem resistance detected) [ ]  Serratia marcescens [ ]  Candida _______ albicans, glabrata, krusei, parapsilosis, tropicalis (fill in blank with correct species)  Name of physician (or Provider) Contacted: Dr. Wynelle Cleveland text paged at ~1500  Changes to prescribed antibiotics required: BCID treatment algorithm recommends narrowing to Ceftriaxone for E.coli bacteremia  Currently, also covering for HCAP (CXR demonstrated right-sided pneumonia), so continuing PsA coverage for now with Cefepime but have adjusted dose to 1g IV q12h.  This was communicated in text page to Dr. Wynelle Cleveland.  Did not receive return call.  Vancomycin already d/c'd since MRSA PCR negative.  Hershal Coria 04/25/2016 5:40 PM

## 2016-04-25 NOTE — Evaluation (Signed)
Clinical/Bedside Swallow Evaluation Patient Details  Name: AZAR VECCHIARELLI MRN: [id number] Date of Birth: 1929-03-12  Today's Date: 04/25/2016 Time: SLP Start Time (ACUTE ONLY): [id number] SLP Stop Time (ACUTE ONLY): 1702 SLP Time Calculation (min) (ACUTE ONLY): 16 min  Past Medical History:  Past Medical History  Diagnosis Date  . Dementia   . Cancer ([place])   . Liver cancer ([place])   . Hepatitis C    Past Surgical History:  Past Surgical History  Procedure Laterality Date  . Hip surgery Left    HPI:  LESLIE DESCHENES is a 82 y.o. male with a history of Hepatocellular Cancer, Hepatitis C, HTN, DM2 And Dementia who was sent from the [facility] to the ED due to increased weakness and decline over the past 2-3 days. Patient was not found to have fever at the facility but a chest X-Ray was ordered and revealed a RLL Pneumonia. Patient was found to have a lactic acidosis of 3.1. A sepsis workup was initiated and he was placed on IV Vancomycin and Cefepime; nursing stated she crushed medications with puree and pt held in mouth and coughed with liquids.  He has been lethargic for majority of the day.    Assessment / Plan / Recommendation Clinical Impression   Pt is at severe risk for aspiration as s/s included oral holding, decreased oral propulsion/awareness of bolus, weak cough, intermittent aphonia/hoarseness, delay in the initiation of the swallow and decreased hyo-laryngeal elevation upon palpation.  Pt should remain NPO until an instrumental assessment can be completed.  ST to determine treatment plan/safest diet after MBS is completed.    Aspiration Risk  Severe aspiration risk    Diet Recommendation   NPO  Medication Administration: Crushed with puree    Other  Recommendations Oral Care Recommendations: Oral care QID   Follow up Recommendations    TBD   Frequency and Duration   TBD         Prognosis Prognosis for Safe Diet Advancement: Guarded Barriers to  Reach Goals: Cognitive deficits      Swallow Study   General Date of Onset: 04/24/16 HPI: CLARION TRASK is a 82 y.o. male with a history of Hepatocellular Cancer, Hepatitis C, HTN, DM2 And Dementia who was sent from the [facility] to the ED due to increased weakness and decline over the past 2-3 days. Patient was not found to have fever at the facility but a chest X-Ray was ordered and revealed a RLL Pneumonia. Patient was found to have a lactic acidosis of 3.1. A sepsi workup was initiated and he was placed on IV Vancomycin and Cefepime.  Type of Study: Bedside Swallow Evaluation Diet Prior to this Study: Regular;Thin liquids Temperature Spikes Noted: No Respiratory Status: Room air History of Recent Intubation: No Behavior/Cognition: Cooperative;Lethargic/Drowsy Oral Cavity Assessment: Dry Oral Care Completed by SLP: Recent completion by staff Oral Cavity - Dentition: Poor condition;Missing dentition Self-Feeding Abilities: Needs assist;Needs set up Patient Positioning: Upright in bed Baseline Vocal Quality: Breathy;Low vocal intensity Volitional Cough: Weak Volitional Swallow: Unable to elicit    Oral/Motor/Sensory Function Overall Oral Motor/Sensory Function: Other (comment) (pt unable to complete full OME d/t cognition)   Ice Chips Ice chips: Impaired Presentation: Spoon Oral Phase Impairments: Reduced lingual movement/coordination Oral Phase Functional Implications: Prolonged oral transit;Oral holding Pharyngeal Phase Impairments: Suspected delayed Swallow;Decreased hyoid-laryngeal movement   Thin Liquid Thin Liquid: Impaired Presentation: Cup Oral Phase Impairments: Reduced lingual movement/coordination Oral Phase Functional Implications: Prolonged oral  transit;Oral holding Pharyngeal  Phase Impairments: Suspected delayed Swallow;Decreased hyoid-laryngeal movement    Nectar Thick Nectar Thick Liquid: Not tested   Honey Thick Honey Thick Liquid: Not  tested   Puree Puree: Impaired Presentation: Spoon Oral Phase Impairments: Reduced lingual movement/coordination Oral Phase Functional Implications: Prolonged oral transit;Oral holding Pharyngeal Phase Impairments: Suspected delayed Swallow;Decreased hyoid-laryngeal movement   Solid      Solid: Not tested        Sajid Ruppert,PAT, M.S., CCC-SLP 04/25/2016,5:12 PM

## 2016-04-26 DIAGNOSIS — R7881 Bacteremia: Secondary | ICD-10-CM

## 2016-04-26 DIAGNOSIS — N390 Urinary tract infection, site not specified: Secondary | ICD-10-CM

## 2016-04-26 LAB — BASIC METABOLIC PANEL
ANION GAP: 9 (ref 5–15)
ANION GAP: 6 (ref 5–15)
BUN: 55 mg/dL — ABNORMAL HIGH (ref 6–20)
BUN: 52 mg/dL — ABNORMAL HIGH (ref 6–20)
CALCIUM: 7.7 mg/dL — AB (ref 8.9–10.3)
CHLORIDE: 122 mmol/L — AB (ref 101–111)
CO2: 13 mmol/L — ABNORMAL LOW (ref 22–32)
CO2: 13 mmol/L — AB (ref 22–32)
Calcium: 7.6 mg/dL — ABNORMAL LOW (ref 8.9–10.3)
Chloride: 125 mmol/L — ABNORMAL HIGH (ref 101–111)
Creatinine, Ser: 1.37 mg/dL — ABNORMAL HIGH (ref 0.61–1.24)
Creatinine, Ser: 1.26 mg/dL — ABNORMAL HIGH (ref 0.61–1.24)
GFR calc non Af Amer: 51 mL/min — ABNORMAL LOW (ref >60)
GFR, EST AFRICAN AMERICAN: 53 mL/min — AB (ref >60)
GFR, EST AFRICAN AMERICAN: 59 mL/min — AB (ref >60)
GFR, EST NON AFRICAN AMERICAN: 46 mL/min — AB (ref >60)
Glucose, Bld: 116 mg/dL — ABNORMAL HIGH (ref 65–99)
Glucose, Bld: 185 mg/dL — ABNORMAL HIGH (ref 65–99)
POTASSIUM: 3.4 mmol/L — AB (ref 3.5–5.1)
Potassium: 3 mmol/L — ABNORMAL LOW (ref 3.5–5.1)
SODIUM: 147 mmol/L — AB (ref 135–145)
SODIUM: 141 mmol/L (ref 135–145)

## 2016-04-26 LAB — COMPREHENSIVE METABOLIC PANEL
ALT: 42 U/L (ref 17–63)
AST: 125 U/L — ABNORMAL HIGH (ref 15–41)
Albumin: 1.6 g/dL — ABNORMAL LOW (ref 3.5–5.0)
Alkaline Phosphatase: 239 U/L — ABNORMAL HIGH (ref 38–126)
Anion gap: 8 (ref 5–15)
BUN: 55 mg/dL — ABNORMAL HIGH (ref 6–20)
CHLORIDE: 122 mmol/L — AB (ref 101–111)
CO2: 13 mmol/L — AB (ref 22–32)
CREATININE: 1.33 mg/dL — AB (ref 0.61–1.24)
Calcium: 7.7 mg/dL — ABNORMAL LOW (ref 8.9–10.3)
GFR, EST AFRICAN AMERICAN: 55 mL/min — AB (ref >60)
GFR, EST NON AFRICAN AMERICAN: 47 mL/min — AB (ref >60)
Glucose, Bld: 145 mg/dL — ABNORMAL HIGH (ref 65–99)
POTASSIUM: 2.7 mmol/L — AB (ref 3.5–5.1)
SODIUM: 143 mmol/L (ref 135–145)
Total Bilirubin: 1.7 mg/dL — ABNORMAL HIGH (ref 0.3–1.2)
Total Protein: 5.5 g/dL — ABNORMAL LOW (ref 6.5–8.1)

## 2016-04-26 LAB — URINE CULTURE

## 2016-04-26 LAB — GLUCOSE, CAPILLARY
GLUCOSE-CAPILLARY: 107 mg/dL — AB (ref 65–99)
GLUCOSE-CAPILLARY: 130 mg/dL — AB (ref 65–99)
GLUCOSE-CAPILLARY: 172 mg/dL — AB (ref 65–99)
GLUCOSE-CAPILLARY: 72 mg/dL (ref 65–99)
Glucose-Capillary: 127 mg/dL — ABNORMAL HIGH (ref 65–99)
Glucose-Capillary: 184 mg/dL — ABNORMAL HIGH (ref 65–99)

## 2016-04-26 LAB — MAGNESIUM: MAGNESIUM: 1.6 mg/dL — AB (ref 1.7–2.4)

## 2016-04-26 LAB — CBC
HEMATOCRIT: 26.4 % — AB (ref 39.0–52.0)
Hemoglobin: 9.5 g/dL — ABNORMAL LOW (ref 13.0–17.0)
MCH: 31.5 pg (ref 26.0–34.0)
MCHC: 36 g/dL (ref 30.0–36.0)
MCV: 87.4 fL (ref 78.0–100.0)
PLATELETS: 123 10*3/uL — AB (ref 150–400)
RBC: 3.02 MIL/uL — ABNORMAL LOW (ref 4.22–5.81)
RDW: 15.6 % — AB (ref 11.5–15.5)
WBC: 25.3 10*3/uL — ABNORMAL HIGH (ref 4.0–10.5)

## 2016-04-26 MED ORDER — POTASSIUM CL IN DEXTROSE 5% 20 MEQ/L IV SOLN
20.0000 meq | INTRAVENOUS | Status: DC
  Administered 2016-04-26: 20 meq via INTRAVENOUS
  Filled 2016-04-26 (×2): qty 1000

## 2016-04-26 MED ORDER — POTASSIUM CHLORIDE 10 MEQ/100ML IV SOLN
10.0000 meq | INTRAVENOUS | Status: AC
  Administered 2016-04-26 (×4): 10 meq via INTRAVENOUS
  Filled 2016-04-26 (×4): qty 100

## 2016-04-26 MED ORDER — DEXTROSE-NACL 5-0.9 % IV SOLN
INTRAVENOUS | Status: DC
  Administered 2016-04-26: 02:00:00 via INTRAVENOUS

## 2016-04-26 MED ORDER — POTASSIUM CL IN DEXTROSE 5% 20 MEQ/L IV SOLN: 20.0000 meq | INTRAVENOUS | Status: DC

## 2016-04-26 MED ORDER — POTASSIUM CL IN DEXTROSE 5% 20 MEQ/L IV SOLN
20.0000 meq | INTRAVENOUS | Status: DC
  Administered 2016-04-26 – 2016-04-27 (×4): 20 meq via INTRAVENOUS
  Filled 2016-04-26 (×6): qty 1000

## 2016-04-26 NOTE — Progress Notes (Signed)
PROGRESS NOTE    Jeffery Rhodes  [id number] DOB: 1929/08/02 DOA: 04/24/2016  PCP: Gildardo Cranker, DO  Outpatient Specialists:     Brief Narrative:  Jeffery Rhodes is a 82 y.o. male with a history of Hepatocellular Cancer, Hepatitis C, HTN, DM2 And Dementia who was sent from the [facility] to the ED due to increased weakness and decline over the past 2-3 days.History about what led to his admission is not available but in the ER he is found to have leukocytosis, acute thrombocytopenia, lactic acidosis, RLL lung infiltrate, UTI and AKI.  Subjective: Sleepy, will not answer questions.   Assessment & Plan:   Principal Problem:  HCAP  / UTI/  Leukocytosis/ lactic acidosis- E coli bactermia - U strep neg - suspect aspiration- this is further reinforced by the fact that he choked on his crushed meds   - SLP eval>> SLP noted overt aspiration- NPO until MBS is done - cont  Cefepime- d/c'd Vanc on 5/7 as MRSA PCR negative - UTI per UA but culture pending - 1 set of blood cultures growing E coli- sensitivities pending  Active Problems:  AKI - baseline Cr 0.8>>  2.0 on admission - likely dehydration- improving with IVF  Hypokalemia - K 2.7- possibly due to diarrhea and NPO status - cont to replace via IV- recheck later today  Diarrhea --  Follow, may need to check stool for c diff if it continues  Hepatocellular carcinoma /  Hepatitis C- elevated LFTs - has not been receiving treatment     Dementia   - late stages- Aricept    Essential hypertension, benign - hypotensive- hold Norvasc  DM 2 - per NH notes last A1c 7.7 - has not been on medications    Thrombocytopenia - due to acute infection?    Pressure ulcer - per nursing  DVT prophylaxis: Lovenox Code Status: full code Family Communication: d/w granddaughter Vanetta Shawl who states she is his POA- she will bring in POA paperwork and answer my questions about code status Disposition Plan:   Return to SNF Consultants:   none Procedures:   none Antimicrobials:  Anti-infectives    Start     Dose/Rate Route Frequency Ordered Stop   04/25/16 2200  vancomycin (VANCOCIN) IVPB 750 mg/150 ml premix  Status:  Discontinued     750 mg 150 mL/hr over 60 Minutes Intravenous Every 24 hours 04/24/16 2126 04/25/16 0913   04/25/16 2100  ceFEPIme (MAXIPIME) 1 g in dextrose 5 % 50 mL IVPB  Status:  Discontinued     1 g 100 mL/hr over 30 Minutes Intravenous Every 24 hours 04/24/16 2126 04/25/16 1501   04/25/16 1600  ceFEPIme (MAXIPIME) 1 g in dextrose 5 % 50 mL IVPB     1 g 100 mL/hr over 30 Minutes Intravenous Every 12 hours 04/25/16 1501     04/24/16 2045  ceFEPIme (MAXIPIME) 2 g in dextrose 5 % 50 mL IVPB     2 g 100 mL/hr over 30 Minutes Intravenous  Once 04/24/16 2039 04/24/16 2150   04/24/16 2045  vancomycin (VANCOCIN) IVPB 1000 mg/200 mL premix     1,000 mg 200 mL/hr over 60 Minutes Intravenous  Once 04/24/16 2039 04/24/16 2309       Objective: Filed Vitals:   04/25/16 1400 04/25/16 2156 04/26/16 0158 04/26/16 0526  BP: 119/60 107/64 116/62 113/60  Pulse: 88 83 82 78  Temp: 97 F (36.1 C) 97.6 F (36.4 C) 98.4 F (36.9  C) 97.7 F (36.5 C)  TempSrc: Axillary Oral Axillary Axillary  Resp:  18 18 19   SpO2: 100% 95% 100% 97%    Intake/Output Summary (Last 24 hours) at 04/26/16 1100 Last data filed at 04/26/16 0320  Gross per 24 hour  Intake      0 ml  Output   1025 ml  Net  -1025 ml   There were no vitals filed for this visit.  Examination: General exam: Alert - Appears comfortable  HEENT: PERRLA, oral mucosa dry,  no sclera icterus or thrush Respiratory system: Clear to auscultation. Respiratory effort normal. Cardiovascular system: S1 & S2 heard, RRR.  No murmurs  Gastrointestinal system: Abdomen soft, non-tender, nondistended. Normal bowel sound. No organomegaly Central nervous system: Alert - cannot tell if he is oriented-  No focal neurological  deficits. Extremities: No cyanosis, clubbing or edema Skin: No rashes or ulcers    Data Reviewed: I have personally reviewed following labs and imaging studies  CBC:  Recent Labs Lab 04/24/16 1851 04/25/16 0330 04/26/16 0419  WBC 24.6* 23.8* 25.3*  NEUTROABS 21.6*  --   --   HGB 11.3* 9.8* 9.5*  HCT 31.4* 27.1* 26.4*  MCV 88.0 87.4 87.4  PLT 126* 117* [id number]*   Basic Metabolic Panel:  Recent Labs Lab 04/24/16 1851 04/25/16 0330 04/26/16 0419 04/26/16 0755  NA 136 137 147* 143  K 4.5 3.4* 3.0* 2.7*  CL 110 116* 125* 122*  CO2 14* 15* 13* 13*  GLUCOSE 188* 122* 116* 145*  BUN 75* 63* 55* 55*  CREATININE 2.05* 1.51* 1.37* 1.33*  CALCIUM 8.7* 7.6* 7.7* 7.7*  MG  --  1.7  --  1.6*   GFR: Estimated Creatinine Clearance: 35.1 mL/min (by C-G formula based on Cr of 1.33). Liver Function Tests:  Recent Labs Lab 04/24/16 1851 04/26/16 0755  AST 181* 125*  ALT 53 42  ALKPHOS 271* 239*  BILITOT 2.0* 1.7*  PROT 6.4* 5.5*  ALBUMIN 2.1* 1.6*    Recent Labs Lab 04/24/16 1851  LIPASE 85*   No results for input(s): AMMONIA in the last 168 hours. Coagulation Profile: No results for input(s): INR, PROTIME in the last 168 hours. Cardiac Enzymes: No results for input(s): CKTOTAL, CKMB, CKMBINDEX, TROPONINI in the last 168 hours. BNP (last 3 results) No results for input(s): PROBNP in the last 8760 hours. HbA1C: No results for input(s): HGBA1C in the last 72 hours. CBG:  Recent Labs Lab 04/25/16 1627 04/25/16 2027 04/26/16 0020 04/26/16 0408 04/26/16 0717  GLUCAP 79 83 72 107* 127*   Lipid Profile: No results for input(s): CHOL, HDL, LDLCALC, TRIG, CHOLHDL, LDLDIRECT in the last 72 hours. Thyroid Function Tests: No results for input(s): TSH, T4TOTAL, FREET4, T3FREE, THYROIDAB in the last 72 hours. Anemia Panel: No results for input(s): VITAMINB12, FOLATE, FERRITIN, TIBC, IRON, RETICCTPCT in the last 72 hours. Urine analysis:    Component Value Date/Time    COLORURINE AMBER* 04/24/2016 2050   APPEARANCEUR CLOUDY* 04/24/2016 2050   LABSPEC 1.018 04/24/2016 2050   PHURINE 6.0 04/24/2016 2050   GLUCOSEU NEGATIVE 04/24/2016 2050   HGBUR MODERATE* 04/24/2016 2050   [place] NEGATIVE 04/24/2016 2050   [place] 04/24/2016 2050   PROTEINUR NEGATIVE 04/24/2016 2050   UROBILINOGEN 4.0* 06/12/2015 0800   NITRITE NEGATIVE 04/24/2016 2050   LEUKOCYTESUR LARGE* 04/24/2016 2050   Sepsis Labs: @LABRCNTIP (procalcitonin:4,lacticidven:4)  ) Recent Results (from the past 240 hour(s))  Culture, blood (routine x 2)     Status: None (Preliminary result)  Collection Time: 04/24/16  6:51 PM  Result Value Ref Range Status   Specimen Description BLOOD RIGHT HAND  Final   Special Requests BOTTLES DRAWN AEROBIC ONLY 6 CC  Final   Culture   Final    NO GROWTH < 24 HOURS Performed at [facility]    Report Status PENDING  Incomplete  Culture, blood (routine x 2)     Status: Abnormal (Preliminary result)   Collection Time: 04/24/16  7:56 PM  Result Value Ref Range Status   Specimen Description BLOOD LEFT HAND  Final   Special Requests IN PEDIATRIC BOTTLE 3CC  Final   Culture  Setup Time   Final    GRAM NEGATIVE RODS IN PEDIATRIC BOTTLE Organism ID to follow CRITICAL RESULT CALLED TO, READ BACK BY AND VERIFIED WITH: GADHIA,J PHARMD 04/25/16 1435 [place] REVIEWED-AGREE WITH RESULT BENFIELD,L    Culture (A)  Final    ESCHERICHIA COLI Standardized susceptibility testing for this organism is not available. Performed at [facility]    Report Status PENDING  Incomplete  Blood Culture ID Panel (Reflexed)     Status: Abnormal   Collection Time: 04/24/16  7:56 PM  Result Value Ref Range Status   Enterococcus species NOT DETECTED NOT DETECTED Final   Vancomycin resistance NOT DETECTED NOT DETECTED Final   Listeria monocytogenes NOT DETECTED NOT DETECTED Final   Staphylococcus species NOT DETECTED NOT DETECTED Final    Staphylococcus aureus NOT DETECTED NOT DETECTED Final   Methicillin resistance NOT DETECTED NOT DETECTED Final   Streptococcus species NOT DETECTED NOT DETECTED Final   Streptococcus agalactiae NOT DETECTED NOT DETECTED Final   Streptococcus pneumoniae NOT DETECTED NOT DETECTED Final   Streptococcus pyogenes NOT DETECTED NOT DETECTED Final   Acinetobacter baumannii NOT DETECTED NOT DETECTED Final   Enterobacteriaceae species NOT DETECTED NOT DETECTED Final   Enterobacter cloacae complex NOT DETECTED NOT DETECTED Final   Escherichia coli DETECTED (A) NOT DETECTED Final    Comment: CRITICAL RESULT CALLED TO, READ BACK BY AND VERIFIED WITH: GADHIA,J PHARMD 04/25/16 1435 WOOTEN,K    Klebsiella oxytoca NOT DETECTED NOT DETECTED Final   Klebsiella pneumoniae NOT DETECTED NOT DETECTED Final   Proteus species NOT DETECTED NOT DETECTED Final   Serratia marcescens NOT DETECTED NOT DETECTED Final   Carbapenem resistance NOT DETECTED NOT DETECTED Final   Haemophilus influenzae NOT DETECTED NOT DETECTED Final   Neisseria meningitidis NOT DETECTED NOT DETECTED Final   Pseudomonas aeruginosa NOT DETECTED NOT DETECTED Final   Candida albicans NOT DETECTED NOT DETECTED Final   Candida glabrata NOT DETECTED NOT DETECTED Final   Candida krusei NOT DETECTED NOT DETECTED Final   Candida parapsilosis NOT DETECTED NOT DETECTED Final   Candida tropicalis NOT DETECTED NOT DETECTED Final    Comment: Performed at [facility]  MRSA PCR Screening     Status: None   Collection Time: 04/25/16  3:03 AM  Result Value Ref Range Status   MRSA by PCR NEGATIVE NEGATIVE Final    Comment:        The GeneXpert MRSA Assay (FDA approved for NASAL specimens only), is one component of a comprehensive MRSA colonization surveillance program. It is not intended to diagnose MRSA infection nor to guide or monitor treatment for MRSA infections.          Radiology Studies: Dg Chest 2 View  04/24/2016  CLINICAL  DATA:  Possible pneumonia. EXAM: CHEST  2 VIEW COMPARISON:  June 12, 2015 FINDINGS: New  infiltrate is seen in the right mid lung peripherally most consistent with pneumonia. Recommend follow-up to resolution. A right PICC line is in good position. Buckshot material projects over the chest. No other interval changes or acute abnormalities. IMPRESSION: Right-sided pneumonia.  Recommend follow-up to resolution. Electronically Signed   By: Dorise Bullion III M.D   On: 04/24/2016 19:34        Scheduled Meds: . ceFEPime (MAXIPIME) IV  1 g Intravenous Q12H  . clopidogrel  75 mg Oral Q breakfast  . donepezil  10 mg Oral QHS  . enoxaparin (LOVENOX) injection  30 mg Subcutaneous QHS  . gabapentin  300 mg Oral BID  . polyethylene glycol  17 g Oral Daily  . potassium chloride  10 mEq Intravenous Q1 Hr x 4  . potassium chloride  20 mEq Oral Daily  . sertraline  75 mg Oral Daily   Continuous Infusions: . dextrose 5 % with KCl 20 mEq / L 20 mEq (04/26/16 0850)     LOS: 2 days    Time spent in minutes: 64    [place], MD [facility] Pager: [email or web address] Password [email or web address] 04/26/2016, 11:00 AM

## 2016-04-26 NOTE — Progress Notes (Signed)
Notified by lab that patient potassium 2.7 at this time.  Notified physician.  Patient receiving runs of IV potassium at this time.  Physician orders put patient on telemetry.

## 2016-04-26 NOTE — Progress Notes (Signed)
Healthcare Power of Attorney copy on patient chart per physician request.

## 2016-04-26 NOTE — Clinical Social Work Note (Signed)
Clinical Social Work Assessment  Patient Details  Name: Jeffery Rhodes MRN: [id number] Date of Birth: Sep 18, 1929  Date of referral:  04/26/16               Reason for consult:  Facility Placement                Permission sought to share information with:  Family Supports, Chartered certified accountant granted to share information::  Yes, Verbal Permission Granted (HCPOA provided verbal permission)  Name::     Ms. Shirlee Limerick  Agency::  [facility]  Relationship::  granddaughter  Sport and exercise psychologist Information:     Housing/Transportation Living arrangements for the past 2 months:  [place] of Information:  Medical laboratory scientific officer Patient Interpreter Needed:  None Criminal Activity/Legal Involvement Pertinent to Current Situation/Hospitalization:  No - Comment as needed Significant Relationships:  Other Family Members (granddaughter/HCPOA) Lives with:  Facility Resident Do you feel safe going back to the place where you live?  Yes Need for family participation in patient care:  Yes (Comment) (decision making)  Care giving concerns:  None- pt is resident at SNF- no caregiving concerns expressed by pt granddaughter/HCPOA   Social Worker assessment / plan:  CSW spoke with HCPOA to discuss plan for pt when stable to DC from hospital- HCPOA reports that pt is LTC resident at [facility] and that plan will be for pt to return to [facility] when he is stable.  Employment status:  Retired Nurse, adult PT Recommendations:  Not assessed at this time Information / Referral to community resources:  [place]  Patient/Family's Response to care:  Pt granddaughter is agreeable to plan for pt return to SNF when DC'd from the hospital.   Patient/Family's Understanding of and Emotional Response to Diagnosis, Current Treatment, and Prognosis:  No questions or concerns at this time- pt HCPOA is hopeful for quick recovery.  Emotional  Assessment Appearance:  Appears stated age Attitude/Demeanor/Rapport:  Unable to Assess Affect (typically observed):  Unable to Assess Orientation:  Oriented to Self Alcohol / Substance use:  Not Applicable Psych involvement (Current and /or in the community):  No (Comment)  Discharge Needs  Concerns to be addressed:  Discharge Planning Concerns Readmission within the last 30 days:  No Current discharge risk:  None Barriers to Discharge:  Continued Medical Work up   [employer], LCSW 04/26/2016, 9:44 AM

## 2016-04-26 NOTE — NC FL2 (Signed)
[place] MEDICAID FL2 LEVEL OF CARE SCREENING TOOL     IDENTIFICATION  Patient Name: Jeffery Rhodes Birthdate: 1929-11-18 Sex: male Admission Date (Current Location): 04/24/2016  [facility] and [place] Number:  Herbalist and Address:  [facility] [place], [place] [address], [place], [place] [address]      Provider Number: [id number]  Attending Physician Name and Address:  Debbe Odea, MD  Relative Name and Phone Number:       Current Level of Care: Hospital Recommended Level of Care: [place] Prior Approval Number:    Date Approved/Denied:   PASRR Number:    Discharge Plan: SNF    Current Diagnoses: Patient Active Problem List   Diagnosis Date Noted  . Pressure ulcer 04/25/2016  . HCAP (healthcare-associated pneumonia) 04/24/2016  . Sepsis ([place]) 04/24/2016  . Leukocytosis 04/24/2016  . Thrombocytopenia ([place]) 04/24/2016  . Depression 12/20/2015  . Essential hypertension, benign 08/26/2015  . Chronic pain 08/26/2015  . Chronic constipation 08/26/2015  . Type II diabetes mellitus with manifestations ([place]) 08/26/2015  . Dementia without behavioral disturbance 06/18/2015  . Hepatitis C 06/18/2015  . Hepatocellular carcinoma ([place]) 06/13/2015  . Right arm pain 06/13/2015  . Palliative care encounter 06/13/2015  . Weakness 06/12/2015  . Failure to thrive in adult 06/12/2015    Orientation RESPIRATION BLADDER Height & Weight     Self  Normal Incontinent Weight:   Height:     BEHAVIORAL SYMPTOMS/MOOD NEUROLOGICAL BOWEL NUTRITION STATUS      Incontinent Diet (see DC)  AMBULATORY STATUS COMMUNICATION OF NEEDS Skin   Total Care Verbally PU Stage and Appropriate Care   PU Stage 2 Dressing:  (PRN)                   Personal Care Assistance Level of Assistance  Total care       Total Care Assistance: Maximum assistance   Functional Limitations Info             SPECIAL CARE FACTORS FREQUENCY                        Contractures      Additional Factors Info  Code Status, Allergies, Psychotropic Code Status Info: FULL Allergies Info: NKA Psychotropic Info: zoloft         Current Medications (04/26/2016):  This is the current hospital active medication list Current Facility-Administered Medications  Medication Dose Route Frequency Provider Last Rate Last Dose  . acetaminophen (TYLENOL) tablet 650 mg  650 mg Oral Q8H PRN Theressa Millard, MD       Or  . acetaminophen (TYLENOL) suppository 650 mg  650 mg Rectal Q6H PRN Harvette Evonnie Dawes, MD      . albuterol (PROVENTIL) (2.5 MG/3ML) 0.083% nebulizer solution 2.5 mg  2.5 mg Nebulization Q2H PRN Harvette Evonnie Dawes, MD      . ceFEPIme (MAXIPIME) 1 g in dextrose 5 % 50 mL IVPB  1 g Intravenous Q12H Luiz Ochoa, RPH   1 g at 04/26/16 0424  . clopidogrel (PLAVIX) tablet 75 mg  75 mg Oral Q breakfast Theressa Millard, MD   75 mg at 04/25/16 1033  . dextrose 5 % with KCl 20 mEq / L  infusion  20 mEq Intravenous Continuous Debbe Odea, MD 125 mL/hr at 04/26/16 0850 20 mEq at 04/26/16 0850  . donepezil (ARICEPT) tablet 10 mg  10 mg Oral QHS Theressa Millard, MD  Stopped at 04/25/16 2200  . enoxaparin (LOVENOX) injection 30 mg  30 mg Subcutaneous QHS Theressa Millard, MD   30 mg at 04/26/16 0200  . gabapentin (NEURONTIN) capsule 300 mg  300 mg Oral BID Theressa Millard, MD   Stopped at 04/25/16 2200  . HYDROmorphone (DILAUDID) injection 0.5-1 mg  0.5-1 mg Intravenous Q3H PRN Theressa Millard, MD      . ondansetron (ZOFRAN) tablet 4 mg  4 mg Oral Q6H PRN Theressa Millard, MD       Or  . ondansetron (ZOFRAN) injection 4 mg  4 mg Intravenous Q6H PRN Harvette Evonnie Dawes, MD      . polyethylene glycol (MIRALAX / GLYCOLAX) packet 17 g  17 g Oral Daily Theressa Millard, MD   17 g at 04/25/16 1035  . potassium chloride 10 mEq in 100 mL IVPB  10 mEq Intravenous Q1 Hr x 4 Saima Rizwan, MD   10 mEq at 04/26/16 0800  . potassium chloride SA  (K-DUR,KLOR-CON) CR tablet 20 mEq  20 mEq Oral Daily Theressa Millard, MD   20 mEq at 04/25/16 1035  . sertraline (ZOLOFT) tablet 75 mg  75 mg Oral Daily Theressa Millard, MD   75 mg at 04/25/16 1035  . sodium chloride flush (NS) 0.9 % injection 10-40 mL  10-40 mL Intracatheter PRN Theressa Millard, MD   10 mL at 04/26/16 0419     Discharge Medications: Please see discharge summary for a list of discharge medications.  Relevant Imaging Results:  Relevant Lab Results:   Additional Information SS#: [id number]  Cranford Mon, [place]

## 2016-04-27 ENCOUNTER — Inpatient Hospital Stay (HOSPITAL_COMMUNITY): Payer: Medicare Other

## 2016-04-27 LAB — GLUCOSE, CAPILLARY
GLUCOSE-CAPILLARY: 184 mg/dL — AB (ref 65–99)
Glucose-Capillary: 149 mg/dL — ABNORMAL HIGH (ref 65–99)
Glucose-Capillary: 164 mg/dL — ABNORMAL HIGH (ref 65–99)
Glucose-Capillary: 148 mg/dL — ABNORMAL HIGH (ref 65–99)

## 2016-04-27 LAB — MAGNESIUM: MAGNESIUM: 1.5 mg/dL — AB (ref 1.7–2.4)

## 2016-04-27 LAB — BLOOD GAS, ARTERIAL
ACID-BASE DEFICIT: 12.5 mmol/L — AB (ref 0.0–2.0)
BICARBONATE: 10.8 meq/L — AB (ref 20.0–24.0)
Drawn by: 27605
FIO2: 0.21
O2 SAT: 91.9 %
PCO2 ART: 19.2 mmHg — AB (ref 35.0–45.0)
PO2 ART: 65.6 mmHg — AB (ref 80.0–100.0)
Patient temperature: 98.6
TCO2: 9.8 mmol/L (ref 0–100)
pH, Arterial: 7.37 (ref 7.350–7.450)

## 2016-04-27 LAB — CULTURE, BLOOD (ROUTINE X 2)

## 2016-04-27 LAB — BASIC METABOLIC PANEL
ANION GAP: 8 (ref 5–15)
BUN: 45 mg/dL — ABNORMAL HIGH (ref 6–20)
CHLORIDE: 118 mmol/L — AB (ref 101–111)
CO2: 12 mmol/L — ABNORMAL LOW (ref 22–32)
Calcium: 7.6 mg/dL — ABNORMAL LOW (ref 8.9–10.3)
Creatinine, Ser: 1.13 mg/dL (ref 0.61–1.24)
GFR calc non Af Amer: 58 mL/min — ABNORMAL LOW (ref >60)
Glucose, Bld: 198 mg/dL — ABNORMAL HIGH (ref 65–99)
POTASSIUM: 3.4 mmol/L — AB (ref 3.5–5.1)
SODIUM: 138 mmol/L (ref 135–145)

## 2016-04-27 LAB — LACTIC ACID, PLASMA: LACTIC ACID, VENOUS: 2.6 mmol/L — AB (ref 0.5–2.0)

## 2016-04-27 LAB — CBC
HCT: 27 % — ABNORMAL LOW (ref 39.0–52.0)
HEMOGLOBIN: 9.7 g/dL — AB (ref 13.0–17.0)
MCH: 31.1 pg (ref 26.0–34.0)
MCHC: 35.9 g/dL (ref 30.0–36.0)
MCV: 86.5 fL (ref 78.0–100.0)
PLATELETS: 95 10*3/uL — AB (ref 150–400)
RBC: 3.12 MIL/uL — AB (ref 4.22–5.81)
RDW: 15.6 % — ABNORMAL HIGH (ref 11.5–15.5)
WBC: 31.6 10*3/uL — AB (ref 4.0–10.5)

## 2016-04-27 LAB — HEMOGLOBIN A1C
Hgb A1c MFr Bld: 5.1 % (ref 4.8–5.6)
MEAN PLASMA GLUCOSE: 100 mg/dL

## 2016-04-27 MED ORDER — SODIUM CHLORIDE 0.9 % IV SOLN
250.0000 mg | Freq: Four times a day (QID) | INTRAVENOUS | Status: DC
  Administered 2016-04-27: 250 mg via INTRAVENOUS
  Filled 2016-04-27 (×2): qty 250

## 2016-04-27 MED ORDER — MAGNESIUM SULFATE 4 GM/100ML IV SOLN
4.0000 g | Freq: Once | INTRAVENOUS | Status: AC
  Administered 2016-04-27: 4 g via INTRAVENOUS
  Filled 2016-04-27: qty 100

## 2016-04-27 MED ORDER — SODIUM BICARBONATE 8.4 % IV SOLN: INTRAVENOUS | Status: DC

## 2016-04-27 MED ORDER — IMIPENEM-CILASTATIN 250 MG IV SOLR
250.0000 mg | Freq: Four times a day (QID) | INTRAVENOUS | Status: DC
  Administered 2016-04-27 – 2016-04-28 (×2): 250 mg via INTRAVENOUS
  Filled 2016-04-27 (×4): qty 250

## 2016-04-27 MED ORDER — ENOXAPARIN SODIUM 40 MG/0.4ML ~~LOC~~ SOLN
40.0000 mg | Freq: Every day | SUBCUTANEOUS | Status: DC
  Administered 2016-04-27: 40 mg via SUBCUTANEOUS
  Filled 2016-04-27 (×2): qty 0.4

## 2016-04-27 MED ORDER — ACETONE (URINE) TEST VI STRP
1.0000 | ORAL_STRIP | Status: DC | PRN
  Filled 2016-04-27: qty 1

## 2016-04-27 MED ORDER — SODIUM CHLORIDE 0.9 % IV SOLN
250.0000 mg | Freq: Once | INTRAVENOUS | Status: AC
  Administered 2016-04-27: 250 mg via INTRAVENOUS
  Filled 2016-04-27: qty 250

## 2016-04-27 NOTE — Progress Notes (Signed)
Speech Pathology:  MBSS complete. Full report located under chart review in imaging section.  Pt with severe dysphagia with aspiration of thin liquids during the swallow; aspiration of puree/nectar residue after the swallow.  Please see report for details.  [name] L. Tivis Ringer, [place] CCC/SLP Pager [phone]

## 2016-04-27 NOTE — Progress Notes (Signed)
  Pharmacy Antibiotic Note  Jeffery Rhodes is a 82 y.o. male admitted on 04/24/2016 with pneumonia and sepsis. Here from [facility] after becoming progressively more lethargic over past week. Started on Vanc/Zosyn at facility on 5/4 for leukocytosis despite unremarkable CXR. WBC worse on 5/5 and brought to ED. CXR 5/5 shows new RML infiltrate. Pharmacy has been consulted for vancomycin and cefepime dosing on admission and Primaxin on 5/8. New AKI noted; LA elevated. Cultures have returned 1/2 vials positive for E. Coli growth (Susceptibility pending). Cefepime changed to Primaxin for ESBL coverage. Pt has no known seizure hx.  Plan:  Initiate Primaxin 250 mg q6h  Follow clinical course, renal function, culture results as available  Follow for de-escalation of antibiotics and LOT     Temp (24hrs), Avg:97.5 F (36.4 C), Min:97.3 F (36.3 C), Max:97.6 F (36.4 C)   Estimated Creatinine Clearance: 41.4 mL/min (by C-G formula based on Cr of 1.13).   No Known Allergies  Antimicrobials this admission: Cefepime 5/5 >>> 5/8 Vanc 5/5 >>> 5/5 Primaxin 5/8 >>>   Dose adjustments this admission: ---  Microbiology results: 5/5 BCx x2: Positive for E. Coli (1/2) 5/5 UCx: Insignificant in growth (3k from Foley)  5/6 MRSA PCR: Negative  Thank you for allowing pharmacy to be a part of this patient's care.

## 2016-04-27 NOTE — Care Management Important Message (Signed)
Important Message  Patient Details  Name: Jeffery Rhodes MRN: [id number] Date of Birth: 21-May-1929   Medicare Important Message Given:  Yes    Camillo Flaming 04/27/2016, 11:01 AMImportant Message  Patient Details  Name: Jeffery Rhodes MRN: [id number] Date of Birth: 18-Dec-1929   Medicare Important Message Given:  Yes    Camillo Flaming 04/27/2016, 11:01 AM

## 2016-04-27 NOTE — Progress Notes (Addendum)
PROGRESS NOTE    JOSPEH Rhodes  [id number] DOB: April 18, 1929 DOA: 04/24/2016  PCP: Gildardo Cranker, DO  Outpatient Specialists:     Brief Narrative:  Jeffery Rhodes is a 82 y.o. male with a history of Hepatocellular Cancer, Hepatitis C, HTN, DM2 And Dementia who was sent from the [facility] to the ED due to increased weakness and decline over the past 2-3 days.History about what led to his admission is not available but in the ER he is found to have leukocytosis, acute thrombocytopenia, lactic acidosis, RLL lung infiltrate, UTI and AKI.  Subjective: Alert, slow to answer questions, speech difficult to understand  Assessment & Plan:   Principal Problem:  HCAP  / UTI/  Leukocytosis/ lactic acidosis- E coli bactermia - U strep neg - suspect aspiration- this is further reinforced by the fact that he choked on his crushed meds   - SLP eval>> SLP noted overt aspiration- NPO until MBS is done  d/c'd Vanc on 5/7 as MRSA PCR negative - UTI per UA but culture shows only 3,00 colonies of unspecified organism - 1 set of blood cultures growing E coli- sensitivities pending but WBC count rising ? ESBL- change Cefepime to Imipenem  Addendum: CXR shows ^ RLL infiltrate-   Active Problems:  AKI - baseline Cr 0.8>>  2.0 on admission - likely dehydration- improving with IVF  Hypokalemia - cont to replace via IV   Diarrhea --  Not very severe yesterday- only 1 BM- does not look like c diff per RN  Hepatocellular carcinoma /  Hepatitis C- elevated LFTs - has not been receiving treatment     Dementia   - late stages- Aricept    Essential hypertension, benign - hypotensive- hold Norvasc  DM 2 - per NH notes last A1c 7.7 - has not been on medications    Thrombocytopenia - due to acute infection?    Pressure ulcer - per nursing  DVT prophylaxis: Lovenox Code Status: full code- HE IS A WARD OF THE STATE Family Communication: d/w granddaughter Vanetta Shawl    Disposition Plan:  Return to SNF Consultants:   none Procedures:   none Antimicrobials:  Anti-infectives    Start     Dose/Rate Route Frequency Ordered Stop   04/27/16 1500  imipenem-cilastatin (PRIMAXIN) 250 mg in sodium chloride 0.9 % 100 mL IVPB     250 mg 200 mL/hr over 30 Minutes Intravenous Every 6 hours 04/27/16 0927     04/27/16 0830  imipenem-cilastatin (PRIMAXIN) 250 mg in sodium chloride 0.9 % 100 mL IVPB     250 mg 200 mL/hr over 30 Minutes Intravenous  Once 04/27/16 0825 04/27/16 0929   04/25/16 2200  vancomycin (VANCOCIN) IVPB 750 mg/150 ml premix  Status:  Discontinued     750 mg 150 mL/hr over 60 Minutes Intravenous Every 24 hours 04/24/16 2126 04/25/16 0913   04/25/16 2100  ceFEPIme (MAXIPIME) 1 g in dextrose 5 % 50 mL IVPB  Status:  Discontinued     1 g 100 mL/hr over 30 Minutes Intravenous Every 24 hours 04/24/16 2126 04/25/16 1501   04/25/16 1600  ceFEPIme (MAXIPIME) 1 g in dextrose 5 % 50 mL IVPB  Status:  Discontinued     1 g 100 mL/hr over 30 Minutes Intravenous Every 12 hours 04/25/16 1501 04/27/16 0727   04/24/16 2045  ceFEPIme (MAXIPIME) 2 g in dextrose 5 % 50 mL IVPB     2 g 100 mL/hr over 30 Minutes Intravenous  Once 04/24/16 2039 04/24/16 2150   04/24/16 2045  vancomycin (VANCOCIN) IVPB 1000 mg/200 mL premix     1,000 mg 200 mL/hr over 60 Minutes Intravenous  Once 04/24/16 2039 04/24/16 2309       Objective: Filed Vitals:   04/26/16 2125 04/27/16 0230 04/27/16 0640 04/27/16 0900  BP: 138/79 134/72 136/73 132/68  Pulse: 82 81 79 76  Temp: 97.5 F (36.4 C) 97.4 F (36.3 C) 97.3 F (36.3 C) 97.8 F (36.6 C)  TempSrc: Axillary Axillary Axillary Axillary  Resp: 19 18 19 19   SpO2: 99% 100% 98% 95%    Intake/Output Summary (Last 24 hours) at 04/27/16 1053 Last data filed at 04/27/16 0453  Gross per 24 hour  Intake      0 ml  Output   1100 ml  Net  -1100 ml   There were no vitals filed for this visit.  Examination: General exam: Alert  - Appears comfortable  HEENT: PERRLA, oral mucosa dry,  no sclera icterus or thrush- severely dry oral mucosa Respiratory system: Clear to auscultation. Respiratory effort normal. Cardiovascular system: S1 & S2 heard, RRR.  No murmurs  Gastrointestinal system: Abdomen soft, non-tender, nondistended. Normal bowel sound. No organomegaly Central nervous system: Alert - cannot tell if he is oriented-  No focal neurological deficits. Extremities: No cyanosis, clubbing or edema Skin: No rashes or ulcers    Data Reviewed: I have personally reviewed following labs and imaging studies  CBC:  Recent Labs Lab 04/24/16 1851 04/25/16 0330 04/26/16 0419 04/27/16 0501  WBC 24.6* 23.8* 25.3* 31.6*  NEUTROABS 21.6*  --   --   --   HGB 11.3* 9.8* 9.5* 9.7*  HCT 31.4* 27.1* 26.4* 27.0*  MCV 88.0 87.4 87.4 86.5  PLT 126* 117* 123* 95*   Basic Metabolic Panel:  Recent Labs Lab 04/25/16 0330 04/26/16 0419 04/26/16 0755 04/26/16 1700 04/27/16 0501  NA 137 147* 143 141 138  K 3.4* 3.0* 2.7* 3.4* 3.4*  CL 116* 125* 122* 122* 118*  CO2 15* 13* 13* 13* 12*  GLUCOSE 122* 116* 145* 185* 198*  BUN 63* 55* 55* 52* 45*  CREATININE 1.51* 1.37* 1.33* 1.26* 1.13  CALCIUM 7.6* 7.7* 7.7* 7.6* 7.6*  MG 1.7  --  1.6*  --   --    GFR: Estimated Creatinine Clearance: 41.4 mL/min (by C-G formula based on Cr of 1.13). Liver Function Tests:  Recent Labs Lab 04/24/16 1851 04/26/16 0755  AST 181* 125*  ALT 53 42  ALKPHOS 271* 239*  BILITOT 2.0* 1.7*  PROT 6.4* 5.5*  ALBUMIN 2.1* 1.6*    Recent Labs Lab 04/24/16 1851  LIPASE 85*   No results for input(s): AMMONIA in the last 168 hours. Coagulation Profile: No results for input(s): INR, PROTIME in the last 168 hours. Cardiac Enzymes: No results for input(s): CKTOTAL, CKMB, CKMBINDEX, TROPONINI in the last 168 hours. BNP (last 3 results) No results for input(s): PROBNP in the last 8760 hours. HbA1C:  Recent Labs  04/25/16 0330  HGBA1C  5.1   CBG:  Recent Labs Lab 04/26/16 0717 04/26/16 1155 04/26/16 1631 04/26/16 2211 04/27/16 0748  GLUCAP 127* 130* 172* 184* 184*   Lipid Profile: No results for input(s): CHOL, HDL, LDLCALC, TRIG, CHOLHDL, LDLDIRECT in the last 72 hours. Thyroid Function Tests: No results for input(s): TSH, T4TOTAL, FREET4, T3FREE, THYROIDAB in the last 72 hours. Anemia Panel: No results for input(s): VITAMINB12, FOLATE, FERRITIN, TIBC, IRON, RETICCTPCT in the last 72 hours. Urine analysis:  Component Value Date/Time   COLORURINE AMBER* 04/24/2016 2050   APPEARANCEUR CLOUDY* 04/24/2016 2050   LABSPEC 1.018 04/24/2016 2050   PHURINE 6.0 04/24/2016 2050   GLUCOSEU NEGATIVE 04/24/2016 2050   HGBUR MODERATE* 04/24/2016 2050   [place] NEGATIVE 04/24/2016 2050   [place] 04/24/2016 2050   PROTEINUR NEGATIVE 04/24/2016 2050   UROBILINOGEN 4.0* 06/12/2015 0800   NITRITE NEGATIVE 04/24/2016 2050   LEUKOCYTESUR LARGE* 04/24/2016 2050   Sepsis Labs: @LABRCNTIP (procalcitonin:4,lacticidven:4)  ) Recent Results (from the past 240 hour(s))  Culture, blood (routine x 2)     Status: None (Preliminary result)   Collection Time: 04/24/16  6:51 PM  Result Value Ref Range Status   Specimen Description BLOOD RIGHT HAND  Final   Special Requests BOTTLES DRAWN AEROBIC ONLY 6 CC  Final   Culture   Final    NO GROWTH 2 DAYS Performed at [facility]    Report Status PENDING  Incomplete  Culture, blood (routine x 2)     Status: Abnormal (Preliminary result)   Collection Time: 04/24/16  7:56 PM  Result Value Ref Range Status   Specimen Description BLOOD LEFT HAND  Final   Special Requests IN PEDIATRIC BOTTLE 3CC  Final   Culture  Setup Time   Final    GRAM NEGATIVE RODS IN PEDIATRIC BOTTLE Organism ID to follow CRITICAL RESULT CALLED TO, READ BACK BY AND VERIFIED WITH: GADHIA,J PHARMD 04/25/16 1435 WOOTEN,K GRAM STAIN REVIEWED-AGREE WITH RESULT BENFIELD,L Performed at [place] (A)  Final   Report Status PENDING  Incomplete  Blood Culture ID Panel (Reflexed)     Status: Abnormal   Collection Time: 04/24/16  7:56 PM  Result Value Ref Range Status   Enterococcus species NOT DETECTED NOT DETECTED Final   Vancomycin resistance NOT DETECTED NOT DETECTED Final   Listeria monocytogenes NOT DETECTED NOT DETECTED Final   Staphylococcus species NOT DETECTED NOT DETECTED Final   Staphylococcus aureus NOT DETECTED NOT DETECTED Final   Methicillin resistance NOT DETECTED NOT DETECTED Final   Streptococcus species NOT DETECTED NOT DETECTED Final   Streptococcus agalactiae NOT DETECTED NOT DETECTED Final   Streptococcus pneumoniae NOT DETECTED NOT DETECTED Final   Streptococcus pyogenes NOT DETECTED NOT DETECTED Final   Acinetobacter baumannii NOT DETECTED NOT DETECTED Final   Enterobacteriaceae species NOT DETECTED NOT DETECTED Final   Enterobacter cloacae complex NOT DETECTED NOT DETECTED Final   Escherichia coli DETECTED (A) NOT DETECTED Final    Comment: CRITICAL RESULT CALLED TO, READ BACK BY AND VERIFIED WITH: GADHIA,J PHARMD 04/25/16 1435 WOOTEN,K    Klebsiella oxytoca NOT DETECTED NOT DETECTED Final   Klebsiella pneumoniae NOT DETECTED NOT DETECTED Final   Proteus species NOT DETECTED NOT DETECTED Final   Serratia marcescens NOT DETECTED NOT DETECTED Final   Carbapenem resistance NOT DETECTED NOT DETECTED Final   Haemophilus influenzae NOT DETECTED NOT DETECTED Final   Neisseria meningitidis NOT DETECTED NOT DETECTED Final   Pseudomonas aeruginosa NOT DETECTED NOT DETECTED Final   Candida albicans NOT DETECTED NOT DETECTED Final   Candida glabrata NOT DETECTED NOT DETECTED Final   Candida krusei NOT DETECTED NOT DETECTED Final   Candida parapsilosis NOT DETECTED NOT DETECTED Final   Candida tropicalis NOT DETECTED NOT DETECTED Final    Comment: Performed at [facility]  Urine culture     Status: Abnormal    Collection Time: 04/24/16  8:50 PM  Result Value Ref Range Status  Specimen Description URINE, CATHETERIZED  Final   Special Requests NONE  Final   Culture (A)  Final    3,000 COLONIES/mL INSIGNIFICANT GROWTH Performed at [facility]    Report Status 04/26/2016 FINAL  Final  MRSA PCR Screening     Status: None   Collection Time: 04/25/16  3:03 AM  Result Value Ref Range Status   MRSA by PCR NEGATIVE NEGATIVE Final    Comment:        The GeneXpert MRSA Assay (FDA approved for NASAL specimens only), is one component of a comprehensive MRSA colonization surveillance program. It is not intended to diagnose MRSA infection nor to guide or monitor treatment for MRSA infections.          Radiology Studies: No results found.      Scheduled Meds: . enoxaparin (LOVENOX) injection  30 mg Subcutaneous QHS  . imipenem-cilastatin  250 mg Intravenous Q6H   Continuous Infusions: . dextrose 5 % with KCl 20 mEq / L 20 mEq (04/27/16 0949)     LOS: 3 days    Time spent in minutes: [place], MD [facility] Pager: [email or web address] Password [facility] 04/27/2016, 10:53 AM

## 2016-04-27 NOTE — Progress Notes (Signed)
CRITICAL VALUE ALERT  Critical value received:  Lactic Acid 2.6  Date of notification:  04/27/16  Time of notification:  [id number]  Critical value read back: Yes  Nurse who received alert:  [facility]  MD notified (1st page):  Rizwan  Time of first page:  [phone]  MD notified (2nd page):  Time of second page:  Responding MD:    Time MD responded:

## 2016-04-27 NOTE — Progress Notes (Signed)
Speech Language Pathology Treatment: Dysphagia  Patient Details Name: Jeffery Rhodes MRN: [id number] DOB: 1929-06-04 Today's Date: 04/27/2016 Time: [id number] SLP Time Calculation (min) (ACUTE ONLY): 33 min  Assessment / Plan / Recommendation Clinical Impression  F/u after 5/6 initial clinical swallow assessment.  Oral suctioning equipment set-up during session and oral care provided, removing thick, hardened secretions from palate.  Pt unable to assist with his own oral care despite max tactile/verbal cues.  Upon completion, provided with trials of ice chips and tspns water.  Pt with prolonged oral preparation and significantly delayed onset of swallow response (between 30-60 seconds with max cues); no overt coughing noted. He did not follow commands; but was alert and made eye contact with examiner.  Recommend continued NPO with f/u MBS this afternoon.  D/W RN.    HPI HPI: LORETO VINCE is a 82 y.o. male with a history of Hepatocellular Cancer, Hepatitis C, HTN, DM2 And Dementia who was sent from the [facility] to the ED due to increased weakness and decline over the past 2-3 days. Patient was not found to have fever at the facility but a chest X-Ray was ordered and revealed a RLL Pneumonia. Patient was found to have a lactic acidosis of 3.1. A sepsi workup was initiated and he was placed on IV Vancomycin and Cefepime.       SLP Plan  Continue with current plan of care;MBS     Recommendations  Diet recommendations: NPO Medication Administration: Crushed with puree             Oral Care Recommendations: Oral care QID Plan: Continue with current plan of care;MBS     GO               Ramiyah Mcclenahan L. Tivis Ringer, [place] CCC/SLP Pager [phone]  Juan Quam Laurice 04/27/2016, 9:51 AM

## 2016-04-27 NOTE — Care Management Note (Addendum)
Case Management Note  Patient Details  Name: HARUTO RIES MRN: [id number] Date of Birth: 1929-01-13  Subjective/Objective:             pna via cxr       Action/Plan:Date:  Apr 27, 2016 Chart reviewed for concurrent status and case management needs. Will continue to follow patient for changes and needs: Velva Harman, BSN, [place], [place]  Expected Discharge Date:      [id number]            Expected Discharge Plan:  [place]  In-House Referral:  Clinical Social Work  Discharge planning Services  CM Consult  Post Acute Care Choice:  NA Choice offered to:  NA  DME Arranged:    DME Agency:     HH Arranged:    [place] Agency:     Status of Service:  In process, will continue to follow  Medicare Important Message Given:  Yes Date Medicare IM Given:    Medicare IM give by:    Date Additional Medicare IM Given:    Additional Medicare Important Message give by:     If discussed at [place] of Stay Meetings, dates discussed:    Additional Comments:  Leeroy Cha, RN 04/27/2016, 12:17 PM

## 2016-04-27 NOTE — Progress Notes (Signed)
LCSW completed long conversation regarding patient's status regarding ward of state with [facility] DSS.  Patient is a current ward of the state per worker due to family losing rights to patient.  DSS reports only speaking to DSS about decision making needs and family is not to be involved in any decision making. Family is allowed to come to hospital and see patient, however if they become involved in care or cause conflict with care, DSS has the right to remove family from seeing patient.  LCSW updated DSS regarding status of patient and placed court order for ward of state in patient chart along with contact information. LCSW also spoke with MD regarding speaking to family and notified of ward of state.  Will continue to follow and assist with needs.  Lane Hacker, MSW Clinical Social Work: [employer] [phone]

## 2016-04-28 ENCOUNTER — Inpatient Hospital Stay (HOSPITAL_COMMUNITY): Payer: Medicare Other

## 2016-04-28 ENCOUNTER — Encounter (HOSPITAL_COMMUNITY): Payer: Self-pay | Admitting: Radiology

## 2016-04-28 LAB — GLUCOSE, CAPILLARY
GLUCOSE-CAPILLARY: 103 mg/dL — AB (ref 65–99)
GLUCOSE-CAPILLARY: 127 mg/dL — AB (ref 65–99)
Glucose-Capillary: 109 mg/dL — ABNORMAL HIGH (ref 65–99)
Glucose-Capillary: 138 mg/dL — ABNORMAL HIGH (ref 65–99)
Glucose-Capillary: 152 mg/dL — ABNORMAL HIGH (ref 65–99)

## 2016-04-28 LAB — CBC
HCT: 26.2 % — ABNORMAL LOW (ref 39.0–52.0)
HEMOGLOBIN: 9.7 g/dL — AB (ref 13.0–17.0)
MCH: 31.5 pg (ref 26.0–34.0)
MCHC: 37 g/dL — AB (ref 30.0–36.0)
MCV: 85.1 fL (ref 78.0–100.0)
Platelets: 69 10*3/uL — ABNORMAL LOW (ref 150–400)
RBC: 3.08 MIL/uL — ABNORMAL LOW (ref 4.22–5.81)
RDW: 15.4 % (ref 11.5–15.5)
WBC: 35.2 10*3/uL — AB (ref 4.0–10.5)

## 2016-04-28 LAB — BASIC METABOLIC PANEL
Anion gap: 7 (ref 5–15)
BUN: 37 mg/dL — AB (ref 6–20)
CHLORIDE: 118 mmol/L — AB (ref 101–111)
CO2: 13 mmol/L — AB (ref 22–32)
CREATININE: 1.02 mg/dL (ref 0.61–1.24)
Calcium: 7.8 mg/dL — ABNORMAL LOW (ref 8.9–10.3)
GFR calc non Af Amer: >60 mL/min (ref >60)
Glucose, Bld: 111 mg/dL — ABNORMAL HIGH (ref 65–99)
POTASSIUM: 3.5 mmol/L (ref 3.5–5.1)
SODIUM: 138 mmol/L (ref 135–145)

## 2016-04-28 LAB — C DIFFICILE QUICK SCREEN W PCR REFLEX
C DIFFICILE (CDIFF) INTERP: NEGATIVE
C DIFFICILE (CDIFF) TOXIN: NEGATIVE
C Diff antigen: NEGATIVE

## 2016-04-28 LAB — MAGNESIUM: Magnesium: 2.3 mg/dL (ref 1.7–2.4)

## 2016-04-28 LAB — LACTIC ACID, PLASMA: LACTIC ACID, VENOUS: 3 mmol/L — AB (ref 0.5–2.0)

## 2016-04-28 MED ORDER — POTASSIUM CL IN DEXTROSE 5% 20 MEQ/L IV SOLN
20.0000 meq | INTRAVENOUS | Status: DC
  Administered 2016-04-28: 20 meq via INTRAVENOUS
  Filled 2016-04-28 (×2): qty 1000

## 2016-04-28 MED ORDER — KCL IN DEXTROSE-NACL 40-5-0.9 MEQ/L-%-% IV SOLN
INTRAVENOUS | Status: DC
Start: 2016-04-28 — End: 2016-04-29
  Administered 2016-04-28 – 2016-04-29 (×4): via INTRAVENOUS
  Filled 2016-04-28 (×5): qty 1000

## 2016-04-28 MED ORDER — IOPAMIDOL (ISOVUE-300) INJECTION 61%
100.0000 mL | Freq: Once | INTRAVENOUS | Status: AC | PRN
  Administered 2016-04-28: 100 mL via INTRAVENOUS

## 2016-04-28 MED ORDER — METRONIDAZOLE IN NACL 5-0.79 MG/ML-% IV SOLN
500.0000 mg | Freq: Three times a day (TID) | INTRAVENOUS | Status: DC
  Administered 2016-04-28: 500 mg via INTRAVENOUS
  Filled 2016-04-28 (×2): qty 100

## 2016-04-28 MED ORDER — SODIUM CHLORIDE 0.9 % IV SOLN
3.0000 g | Freq: Four times a day (QID) | INTRAVENOUS | Status: DC
  Administered 2016-04-28 – 2016-04-30 (×8): 3 g via INTRAVENOUS
  Filled 2016-04-28 (×8): qty 3

## 2016-04-28 MED ORDER — CEFAZOLIN SODIUM 1-5 GM-% IV SOLN
1.0000 g | Freq: Three times a day (TID) | INTRAVENOUS | Status: DC
  Administered 2016-04-28: 1 g via INTRAVENOUS
  Filled 2016-04-28 (×2): qty 50

## 2016-04-28 MED ORDER — DIATRIZOATE MEGLUMINE & SODIUM 66-10 % PO SOLN
15.0000 mL | ORAL | Status: DC | PRN
  Administered 2016-04-28: 15 mL via ORAL
  Filled 2016-04-28 (×2): qty 30

## 2016-04-28 MED ORDER — CEFAZOLIN SODIUM-DEXTROSE 2-4 GM/100ML-% IV SOLN
2.0000 g | Freq: Three times a day (TID) | INTRAVENOUS | Status: DC
  Administered 2016-04-28: 2 g via INTRAVENOUS
  Filled 2016-04-28: qty 100

## 2016-04-28 NOTE — Progress Notes (Signed)
Initial Nutrition Assessment  DOCUMENTATION CODES:   Severe malnutrition in context of chronic illness  INTERVENTION:  -Ensure Enlive po BID, each supplement provides 350 kcal and 20 grams of protein with diet advancement -RD continue to monitor  NUTRITION DIAGNOSIS:   Malnutrition related to chronic illness as evidenced by severe depletion of muscle mass, severe depletion of body fat.  GOAL:   Patient will meet greater than or equal to 90% of their needs  MONITOR:   PO intake, I & O's, Labs, Diet advancement, Supplement acceptance  REASON FOR ASSESSMENT:   Malnutrition Screening Tool    ASSESSMENT:   Jeffery Rhodes is a 82 y.o. male with a history of Hepatocellular Cancer, Hepatitis C, HTN, DM2 And Dementia who was sent from the [facility] to the ED due to increased weakness and decline over the past 2-3 days. Patient was not found to have fever at the facility but a chest X-Ray was ordered and revealed a RLL Pneumonia. Patient was found to have a lactic acidosis of 3.1. A sepsi workup was initiated and he was placed on IV Vancomycin and Cefepime.   Attempted to speak with patient at bedside but he appears to suffer from AMS, did not respond to any questions. Per chart review, pt presents septic on antibiotics with diarrhea. He is currently NPO s/p SLP MBS - has been deemed NPO with ice chips for poor swallow function.  Nutrition-Focused physical exam completed. Findings are severe fat depletion, severe muscle depletion, and no edema.   Provide Ensure w/ diet advancement follow SLP recommendations. Patient could use any nutrition assistance possible. Consider nutrition support if GOC deems appropriate.  Labs and Medications reviewed.  Diet Order:  Diet NPO time specified Except for: Ice Chips  Skin:  Wound (see comment) (Stg II to Sacrum)  Last BM:  5/9  Height:   Ht Readings from Last 1 Encounters:  04/28/16 5\' 7"  (1.702 m)    Weight:    Wt Readings from Last 1 Encounters:  04/28/16 132 lb 7.9 oz (60.1 kg)    Ideal Body Weight:  67.27 kg  BMI:  Body mass index is 20.75 kg/(m^2).  Estimated Nutritional Needs:   Kcal:  1500-1800  Protein:  60-75 grams  Fluid:  >/= 1.5L  EDUCATION NEEDS:   No education needs identified at this time  Satira Anis. Dorthy Magnussen, MS, RD LDN After Hours/Weekend Pager [phone]

## 2016-04-28 NOTE — Progress Notes (Signed)
CRITICAL VALUE ALERT  Critical value received:  Lactic acid 3.0  Date of notification:  04/28/2016  Time of notification:  [id number]  Critical value read back:Yes.    Nurse who received alert:  Joyce Copa, RN  MD notified (1st page):  S. Wynelle Cleveland, MD  Time of first page:  1715  MD notified (2nd page):  Time of second page:  Responding MD:  S. Wynelle Cleveland, MD   Time MD responded:  [phone]
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
## 2016-04-28 NOTE — Progress Notes (Signed)
Per [name] [phone]), State SW, [place] [phone]) should be notified if patient passes.

## 2016-04-28 NOTE — Progress Notes (Signed)
Speech Language Pathology Treatment: Dysphagia  Patient Details Name: Jeffery Rhodes MRN: [id number] DOB: July 14, 1929 Today's Date: 04/28/2016 Time: [id number] SLP Time Calculation (min) (ACUTE ONLY): 18 min  Assessment / Plan / Recommendation Clinical Impression  F/u after yesterday's MBS.  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  Provided oral care.  Clinical signs of dysphagia unchanged from yesterday - continues with delays in oral transitioning and swallow initiation.  Consumption of ice chips and 1/2 tspns water after oral care elicit persistent delayed, wet cough.  Despite improved MS, doubt functional improvement in swallow status.  Requires max cues/prompting for quick, effortful swallow. Recommend continued NPO with ice chips pending decisions re: plan of care.  D/W RN.    HPI HPI: Jeffery Rhodes is a 82 y.o. male with a history of Hepatocellular Cancer, Hepatitis C, HTN, DM2 And Dementia who was sent from the [facility] to the ED due to increased weakness and decline over the past 2-3 days. Patient was not found to have fever at the facility but a chest X-Ray was ordered and revealed a RLL Pneumonia. Patient was found to have a lactic acidosis of 3.1. A sepsi workup was initiated and he was placed on IV Vancomycin and Cefepime.       SLP Plan  Continue with current plan of care     Recommendations  Diet recommendations: NPO             Oral Care Recommendations: Oral care QID Follow up Recommendations:  (tba) Plan: Continue with current plan of care     GO                Juan Quam Laurice 04/28/2016, 11:00 AM

## 2016-04-28 NOTE — Progress Notes (Signed)
This RN will be taking over nursing care for the patient and agrees with the previous RN's assessment. Will continue to monitor.  Joyce Copa, RN

## 2016-04-28 NOTE — Progress Notes (Signed)
Pharmacy Antibiotic Note  Jeffery Rhodes is a 82 y.o. male admitted on 04/24/2016 with pneumonia and sepsis. Here from [facility] after becoming progressively more lethargic over past week. Started on Vanc/Zosyn at facility on 5/4 for leukocytosis despite unremarkable CXR. WBC worse on 5/5 and brought to ED. CXR 5/5 shows new RML infiltrate. Pharmacy had been consulted for vancomycin and cefepime dosing on admission and Primaxin on 5/8. New AKI noted; LA elevated. Cultures have returned 1/2 vials positive for E. Coli growth (Susceptibility pending). Cefepime was changed to Primaxin for ESBL coverage. Pt has no known seizure hx.  Today sensitivities show 1/2 blood growing E. Coli is pan sensitive (except resistant to Ampicillin).  Primaxin has been d/c'ed and pharmacy consulted to dose Unasyn for coverage of aspiration pneumonia and E. Coli bacteremia.     Plan:  Unasyn 3gm IV q6h  Follow clinical course, renal function, culture results as available  Height: 5\' 7"  (170.2 cm) Weight: 132 lb 7.9 oz (60.1 kg) IBW/kg (Calculated) : 66.1  Temp (24hrs), Avg:98 F (36.7 C), Min:97.7 F (36.5 C), Max:98.4 F (36.9 C)   Recent Labs Lab 04/24/16 1851 04/24/16 1956 04/24/16 2117 04/25/16 0330 04/25/16 0350 04/26/16 0419 04/26/16 0755 04/26/16 1700 04/27/16 0501 04/27/16 1320 04/28/16 0655 04/28/16 1625  WBC 24.6*  --   --  23.8*  --  25.3*  --   --  31.6*  --  35.2*  --   CREATININE 2.05*  --   --  1.51*  --  1.37* 1.33* 1.26* 1.13  --  1.02  --   LATICACIDVEN  --  3.1* 1.83  --  1.4  --   --   --   --  2.6*  --  3.0*    Estimated Creatinine Clearance: 45.8 mL/min (by C-G formula based on Cr of 1.02).    No Known Allergies  Antimicrobials this admission: Cefepime 5/5 >>> 5/8 Vanc 5/5 >>> 5/5 Primaxin 5/8 >>> 5/9 Flagyl 5/9 >>> 5/9 Ancef 5/9 >>> 5/9 Unasyn 5/9 >>>  Dose adjustments this admission:   Microbiology results: 5/5 BCx x2: Positive for E. Coli (1/2) -  pan sensitive (except resistant to Amp) 5/5 UCx: Insignificant in growth (3k from Foley)  5/6 MRSA PCR: Negative 5/9 C Diff - Negative  Thank you for allowing pharmacy to be a part of this patient's care.  Everette Rank, PharmD 04/28/2016 5:40 PM

## 2016-04-29 DIAGNOSIS — A4151 Sepsis due to Escherichia coli [E. coli]: Principal | ICD-10-CM

## 2016-04-29 DIAGNOSIS — C220 Liver cell carcinoma: Secondary | ICD-10-CM

## 2016-04-29 DIAGNOSIS — F0390 Unspecified dementia without behavioral disturbance: Secondary | ICD-10-CM

## 2016-04-29 DIAGNOSIS — D72829 Elevated white blood cell count, unspecified: Secondary | ICD-10-CM

## 2016-04-29 DIAGNOSIS — I10 Essential (primary) hypertension: Secondary | ICD-10-CM

## 2016-04-29 DIAGNOSIS — J948 Other specified pleural conditions: Secondary | ICD-10-CM

## 2016-04-29 DIAGNOSIS — J189 Pneumonia, unspecified organism: Secondary | ICD-10-CM

## 2016-04-29 DIAGNOSIS — T17908A Unspecified foreign body in respiratory tract, part unspecified causing other injury, initial encounter: Secondary | ICD-10-CM | POA: Insufficient documentation

## 2016-04-29 DIAGNOSIS — T17998D Other foreign object in respiratory tract, part unspecified causing other injury, subsequent encounter: Secondary | ICD-10-CM

## 2016-04-29 DIAGNOSIS — J90 Pleural effusion, not elsewhere classified: Secondary | ICD-10-CM | POA: Insufficient documentation

## 2016-04-29 LAB — BASIC METABOLIC PANEL
ANION GAP: 6 (ref 5–15)
BUN: 30 mg/dL — ABNORMAL HIGH (ref 6–20)
CHLORIDE: 122 mmol/L — AB (ref 101–111)
CO2: 12 mmol/L — AB (ref 22–32)
Calcium: 7.5 mg/dL — ABNORMAL LOW (ref 8.9–10.3)
Creatinine, Ser: 1.05 mg/dL (ref 0.61–1.24)
GFR calc Af Amer: >60 mL/min (ref >60)
GFR calc non Af Amer: >60 mL/min (ref >60)
GLUCOSE: 171 mg/dL — AB (ref 65–99)
POTASSIUM: 4.2 mmol/L (ref 3.5–5.1)
Sodium: 140 mmol/L (ref 135–145)

## 2016-04-29 LAB — GLUCOSE, CAPILLARY
Glucose-Capillary: 175 mg/dL — ABNORMAL HIGH (ref 65–99)
Glucose-Capillary: 161 mg/dL — ABNORMAL HIGH (ref 65–99)
Glucose-Capillary: 138 mg/dL — ABNORMAL HIGH (ref 65–99)
Glucose-Capillary: 117 mg/dL — ABNORMAL HIGH (ref 65–99)

## 2016-04-29 LAB — CULTURE, BLOOD (ROUTINE X 2): Culture: NO GROWTH

## 2016-04-29 LAB — CBC
HEMATOCRIT: 25.9 % — AB (ref 39.0–52.0)
HEMOGLOBIN: 9.5 g/dL — AB (ref 13.0–17.0)
MCH: 31.3 pg (ref 26.0–34.0)
MCHC: 36.7 g/dL — AB (ref 30.0–36.0)
MCV: 85.2 fL (ref 78.0–100.0)
Platelets: 74 10*3/uL — ABNORMAL LOW (ref 150–400)
RBC: 3.04 MIL/uL — AB (ref 4.22–5.81)
RDW: 15.5 % (ref 11.5–15.5)
WBC: 36.8 10*3/uL — ABNORMAL HIGH (ref 4.0–10.5)

## 2016-04-29 MED ORDER — HEPARIN (PORCINE) IN NACL 100-0.45 UNIT/ML-% IJ SOLN
1000.0000 [IU]/h | INTRAMUSCULAR | Status: DC
  Administered 2016-04-29: 950 [IU]/h via INTRAVENOUS
  Administered 2016-04-30: 1000 [IU]/h via INTRAVENOUS
  Filled 2016-04-29 (×2): qty 250

## 2016-04-29 MED ORDER — STERILE WATER FOR INJECTION IV SOLN
INTRAVENOUS | Status: DC
  Administered 2016-04-29 – 2016-05-01 (×4): via INTRAVENOUS
  Filled 2016-04-29 (×3): qty 850

## 2016-04-29 MED ORDER — HEPARIN BOLUS VIA INFUSION
3000.0000 [IU] | Freq: Once | INTRAVENOUS | Status: AC
  Administered 2016-04-29: 3000 [IU] via INTRAVENOUS
  Filled 2016-04-29: qty 3000

## 2016-04-29 NOTE — Progress Notes (Signed)
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 enters airway, passes BELOW cords and not ejected out despite cough attempt by patient Pharyngeal- Thin Straw -- Pharyngeal -- Pharyngeal- Puree Delayed swallow initiation-pyriform sinuses;Reduced pharyngeal peristalsis;Reduced airway/laryngeal closure;Reduced tongue base retraction;Penetration/Apiration after swallow;Trace aspiration;Pharyngeal residue - valleculae;Pharyngeal residue - pyriform Pharyngeal Material enters airway, passes BELOW cords and not ejected out despite cough attempt by patient Pharyngeal- Mechanical Soft -- Pharyngeal -- Pharyngeal- Regular -- Pharyngeal -- Pharyngeal- Multi-consistency -- Pharyngeal -- Pharyngeal- Pill -- Pharyngeal -- Pharyngeal Comment --  No flowsheet data found. [facility] IP GO 06/13/2015 Functional Assessment Tool Used skilled clinical judgment Functional Limitations Swallowing Swallow Current Status [phone]) CI Swallow Goal Status [id number]) CI  Swallow Discharge Status [id number]) CI Motor Speech Current Status [id number]) (None) Motor Speech Goal Status [id number]) (None) Motor Speech Goal Status [id number]) (None) Spoken Language Comprehension Current Status [id number]) (None) Spoken Language Comprehension Goal Status [id number]) (None) Spoken Language Comprehension Discharge Status [phone]) (None) Spoken Language Expression Current Status [phone]) (None) Spoken Language Expression Goal Status [id number]) (None) Spoken Language Expression Discharge Status [phone]) (None) Attention Current Status [id number]) (None) Attention Goal Status EY:7266000) (None) Attention Discharge Status [phone]) (None) Memory Current Status [id number]:[id number]) (None) Memory Goal Status [id number]) (None) Memory Discharge Status QC:[id number]) (None) Voice Current Status [id number]) (None) Voice Goal Status [id number]) (None) Voice Discharge Status [id number]) (None) Other Speech-Language Pathology Functional Limitation [phone]) (None) Other Speech-Language Pathology Functional Limitation Goal Status [id number]) (None) Other Speech-Language Pathology Functional Limitation Discharge Status [phone]) (None) Juan Quam Laurice 04/27/2016, 2:32 PM               Masey Scheiber, DO  [facility] Pager [phone]  If 7PM-7AM, please contact night-coverage [email or web address] Password [email or web address] 04/29/2016, 2:08 PM   LOS: 5 days

## 2016-04-29 NOTE — Progress Notes (Signed)
ANTICOAGULATION CONSULT NOTE - Initial Consult  Pharmacy Consult for Heparin Indication: DVT- portal vein thrombosis  No Known Allergies  Patient Measurements: Height: 5\' 7"  (170.2 cm) Weight: 132 lb 7.9 oz (60.1 kg) IBW/kg (Calculated) : 66.1  Vital Signs: Temp: 97.4 F (36.3 C) (05/10 1300) Temp Source: Axillary (05/10 1300) BP: 134/64 mmHg (05/10 1300) Pulse Rate: 87 (05/10 1300)  Labs:  Recent Labs  04/27/16 0501 04/28/16 0655 04/29/16 0545  HGB 9.7* 9.7* 9.5*  HCT 27.0* 26.2* 25.9*  PLT 95* 69* 74*  CREATININE 1.13 1.02 1.05    Estimated Creatinine Clearance: 44.5 mL/min (by C-G formula based on Cr of 1.05).   Medical History: Past Medical History  Diagnosis Date  . Dementia   . Cancer ([place])   . Liver cancer ([place])   . Hepatitis C     Medications:  Infusions:  . dextrose 5 % and 0.9 % NaCl with KCl 40 mEq/L 125 mL/hr at 04/29/16 0741    Assessment: 82 yo M admitted 04/24/16 with hx hepatocellular CA and Ecoli sepsis.  Known to pharmacy from antibiotic dosing.  He now has + portal vein thrombosis and pharmacy consulted to start heparin.   He was on Lovenox for VTE px this admission which was d/c'd after 5/8 dose due to thrombocytopenia.  Pltc improved today, but still 74.  Spoke to Dr Tat who wishes to proceed with heparin (vs DTI) at this time, but will re-evaluate for HITT in am if pltc drops.  No acute bleeding reported.   Goal of Therapy:  Heparin level 0.3-0.7 units/ml Monitor platelets by anticoagulation protocol: Yes   Plan:  Give 3000 units bolus x 1 Start heparin infusion at 950 units/hr Check anti-Xa level in 8 hours and daily while on heparin Continue to monitor H&H and platelets  F/U for planned procedures and need to hold heparin  Netta Cedars, PharmD, BCPS Pager: [phone] 04/29/2016,2:47 PM

## 2016-04-30 ENCOUNTER — Inpatient Hospital Stay (HOSPITAL_COMMUNITY): Payer: Medicare Other

## 2016-04-30 DIAGNOSIS — J189 Pneumonia, unspecified organism: Secondary | ICD-10-CM | POA: Insufficient documentation

## 2016-04-30 DIAGNOSIS — Z7189 Other specified counseling: Secondary | ICD-10-CM

## 2016-04-30 DIAGNOSIS — Z515 Encounter for palliative care: Secondary | ICD-10-CM

## 2016-04-30 LAB — PROTEIN, BODY FLUID

## 2016-04-30 LAB — LACTATE DEHYDROGENASE: LDH: 229 U/L — AB (ref 98–192)

## 2016-04-30 LAB — HEPATIC FUNCTION PANEL
ALBUMIN: 1.4 g/dL — AB (ref 3.5–5.0)
ALT: 20 U/L (ref 17–63)
AST: 71 U/L — AB (ref 15–41)
Alkaline Phosphatase: 269 U/L — ABNORMAL HIGH (ref 38–126)
Bilirubin, Direct: 1 mg/dL — ABNORMAL HIGH (ref 0.1–0.5)
Indirect Bilirubin: 1 mg/dL — ABNORMAL HIGH (ref 0.3–0.9)
TOTAL PROTEIN: 5.5 g/dL — AB (ref 6.5–8.1)
Total Bilirubin: 2 mg/dL — ABNORMAL HIGH (ref 0.3–1.2)

## 2016-04-30 LAB — BODY FLUID CELL COUNT WITH DIFFERENTIAL
Eos, Fluid: 0 %
LYMPHS FL: 8 %
Monocyte-Macrophage-Serous Fluid: 24 % — ABNORMAL LOW (ref 50–90)
NEUTROPHIL FLUID: 68 % — AB (ref 0–25)
Total Nucleated Cell Count, Fluid: 139 cu mm (ref 0–1000)

## 2016-04-30 LAB — BASIC METABOLIC PANEL
Anion gap: 9 (ref 5–15)
BUN: 27 mg/dL — ABNORMAL HIGH (ref 6–20)
CHLORIDE: 126 mmol/L — AB (ref 101–111)
CO2: 12 mmol/L — AB (ref 22–32)
CREATININE: 0.96 mg/dL (ref 0.61–1.24)
Calcium: 7.5 mg/dL — ABNORMAL LOW (ref 8.9–10.3)
GFR calc non Af Amer: >60 mL/min (ref >60)
GLUCOSE: 90 mg/dL (ref 65–99)
Potassium: 4.1 mmol/L (ref 3.5–5.1)
SODIUM: 147 mmol/L — AB (ref 135–145)

## 2016-04-30 LAB — GLUCOSE, CAPILLARY
GLUCOSE-CAPILLARY: 77 mg/dL (ref 65–99)
GLUCOSE-CAPILLARY: 87 mg/dL (ref 65–99)
Glucose-Capillary: 76 mg/dL (ref 65–99)
Glucose-Capillary: 66 mg/dL (ref 65–99)

## 2016-04-30 LAB — GRAM STAIN

## 2016-04-30 LAB — GLUCOSE, SEROUS FLUID: Glucose, Fluid: 86 mg/dL

## 2016-04-30 LAB — CBC
HCT: 24.4 % — ABNORMAL LOW (ref 39.0–52.0)
Hemoglobin: 9 g/dL — ABNORMAL LOW (ref 13.0–17.0)
MCH: 31.4 pg (ref 26.0–34.0)
MCHC: 36.9 g/dL — AB (ref 30.0–36.0)
MCV: 85 fL (ref 78.0–100.0)
PLATELETS: 68 10*3/uL — AB (ref 150–400)
RBC: 2.87 MIL/uL — ABNORMAL LOW (ref 4.22–5.81)
RDW: 15.6 % — AB (ref 11.5–15.5)
WBC: 43.3 10*3/uL — ABNORMAL HIGH (ref 4.0–10.5)

## 2016-04-30 LAB — HEPARIN LEVEL (UNFRACTIONATED)
Heparin Unfractionated: 0.32 IU/mL (ref 0.30–0.70)
Heparin Unfractionated: 0.4 IU/mL (ref 0.30–0.70)

## 2016-04-30 LAB — LACTATE DEHYDROGENASE, PLEURAL OR PERITONEAL FLUID: LD FL: 92 U/L — AB (ref 3–23)

## 2016-04-30 MED ORDER — DEXTROSE 5 % IV SOLN
INTRAVENOUS | Status: DC
  Administered 2016-04-30 – 2016-05-01 (×2): via INTRAVENOUS

## 2016-04-30 MED ORDER — FONDAPARINUX SODIUM 7.5 MG/0.6ML ~~LOC~~ SOLN
7.5000 mg | Freq: Every day | SUBCUTANEOUS | Status: DC
  Administered 2016-04-30 – 2016-05-01 (×2): 7.5 mg via SUBCUTANEOUS
  Filled 2016-04-30 (×2): qty 0.6

## 2016-04-30 MED ORDER — METRONIDAZOLE IN NACL 5-0.79 MG/ML-% IV SOLN
500.0000 mg | Freq: Three times a day (TID) | INTRAVENOUS | Status: DC
  Administered 2016-04-30 – 2016-05-01 (×3): 500 mg via INTRAVENOUS
  Filled 2016-04-30 (×4): qty 100

## 2016-04-30 MED ORDER — HEPARIN SOD (PORK) LOCK FLUSH 100 UNIT/ML IV SOLN
500.0000 [IU] | INTRAVENOUS | Status: DC | PRN
  Filled 2016-04-30: qty 5

## 2016-04-30 MED ORDER — HEPARIN SOD (PORK) LOCK FLUSH 100 UNIT/ML IV SOLN
500.0000 [IU] | INTRAVENOUS | Status: DC
  Filled 2016-04-30: qty 5

## 2016-04-30 MED ORDER — LIP MEDEX EX OINT
TOPICAL_OINTMENT | CUTANEOUS | Status: AC
  Administered 2016-04-30: 19:00:00
  Filled 2016-04-30: qty 7

## 2016-04-30 MED ORDER — DEXTROSE 5 % IV SOLN
1.0000 g | Freq: Two times a day (BID) | INTRAVENOUS | Status: DC
  Administered 2016-04-30 – 2016-05-01 (×2): 1 g via INTRAVENOUS
  Filled 2016-04-30 (×2): qty 1

## 2016-04-30 NOTE — Progress Notes (Signed)
Speech Language Pathology Treatment: Dysphagia  Patient Details Name: Jeffery Rhodes MRN: [id number] DOB: Apr 01, 1929 Today's Date: 04/30/2016 Time: [id number] SLP Time Calculation (min) (ACUTE ONLY): 25 min  Assessment / Plan / Recommendation Clinical Impression  Pt benefited from oral care by this SLP using oral suction, toothette with water- though he is mildly resistant.  Gurgly breathing noted at baseline without pt awareness nor attempts to cough to clear with cues.   Pt with dried anterior secretions and frothy posterior oral cavity secretions clearing approximately 75% with oral care.    Provided pt with single ice chip boluses x2 only - excessively delayed swallow noted (beyond 60 seconds) with post-swallow worsening congestion.    Unfortunately has not demonstrated functional improvement with swallowing and is aspirating secretions at this time without ability to clear.  Note that MD addressing dysphagia with legal guardian- thank you.  SLP to sign off at this time as pt is not following directions, is grossly weak and remains grossly dysphagic.   Recommend continue aggressive oral care and single ice chips for comfort.         HPI HPI: Jeffery Rhodes is a 82 y.o. male with a history of Hepatocellular Cancer, Hepatitis C, HTN, DM2 And Dementia who was sent from the [facility] to the ED due to increased weakness and decline over the past 2-3 days. Patient was not found to have fever at the facility but a chest X-Ray was ordered and revealed a RLL Pneumonia. Patient was found to have a lactic acidosis of 3.1. A sepsi workup was initiated and he was placed on IV Vancomycin and Cefepime.       SLP Plan  Discharge SLP treatment due to (comment) (lack of progress)     Recommendations  Diet recommendations: Other(comment) (ice chips only for comfort)             Oral Care Recommendations: Oral care QID Follow up Recommendations:  (tba) Plan: Discharge SLP  treatment due to (comment) (lack of progress)     [place], [place] [facility] SLP [phone]

## 2016-04-30 NOTE — Progress Notes (Signed)
ANTICOAGULATION CONSULT NOTE - Follow Up Consult  Pharmacy Consult for Heparin Indication: DVT- portal vein thrombosis  No Known Allergies  Patient Measurements: Height: 5\' 7"  (170.2 cm) Weight: 132 lb 7.9 oz (60.1 kg) IBW/kg (Calculated) : 66.1 Heparin Dosing Weight:   Vital Signs: Temp: 98.2 F (36.8 C) (05/10 2105) Temp Source: Axillary (05/10 2105) BP: 120/57 mmHg (05/10 2105) Pulse Rate: 87 (05/10 2105)  Labs:  Recent Labs  04/27/16 0501 04/28/16 0655 04/29/16 0545 04/29/16 2357  HGB 9.7* 9.7* 9.5*  --   HCT 27.0* 26.2* 25.9*  --   PLT 95* 69* 74*  --   HEPARINUNFRC  --   --   --  0.32  CREATININE 1.13 1.02 1.05  --     Estimated Creatinine Clearance: 44.5 mL/min (by C-G formula based on Cr of 1.05).   Medications:  Infusions:  . heparin 950 Units/hr (04/29/16 1634)  .  sodium bicarbonate 150 mEq in sterile water 1000 mL infusion 75 mL/hr at 04/29/16 2004    Assessment: Patient with heparin at goal.  No heparin issues per RN. While heparin at goal, at lower end of goal.  Will increase rate to maintain within goal.  Goal of Therapy:  Heparin level 0.3-0.7 units/ml Monitor platelets by anticoagulation protocol: Yes   Plan:  Increase heparin to 1000 units/hr Recheck level at [place], Shea Stakes [place] 04/30/2016,1:10 AM

## 2016-04-30 NOTE — Progress Notes (Signed)
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 -- Oral - Regular -- Oral - Multi-Consistency -- Oral - Pill -- Oral Phase - Comment --  CHL IP PHARYNGEAL PHASE 04/27/2016 Pharyngeal Phase Impaired Pharyngeal- Pudding Teaspoon -- Pharyngeal -- Pharyngeal- Pudding Cup -- Pharyngeal -- Pharyngeal- Honey Teaspoon -- Pharyngeal -- Pharyngeal- Honey Cup -- Pharyngeal -- Pharyngeal- Nectar Teaspoon -- Pharyngeal -- Pharyngeal- Nectar Cup Delayed swallow initiation-pyriform sinuses;Reduced pharyngeal peristalsis;Reduced airway/laryngeal closure;Reduced tongue base retraction;Penetration/Aspiration during swallow;Penetration/Apiration after swallow;Trace aspiration;Pharyngeal residue - valleculae;Pharyngeal residue - pyriform Pharyngeal Material enters airway, passes BELOW cords and not ejected out despite cough attempt by patient Pharyngeal- Nectar Straw -- Pharyngeal -- Pharyngeal- Thin Teaspoon -- Pharyngeal --  Pharyngeal- Thin Cup Delayed swallow initiation-pyriform sinuses;Reduced pharyngeal peristalsis;Reduced airway/laryngeal closure;Reduced tongue base retraction;Penetration/Aspiration during swallow;Penetration/Apiration after swallow;Trace aspiration;Pharyngeal residue - valleculae;Pharyngeal residue - pyriform Pharyngeal Material enters airway, passes BELOW cords and not ejected out despite cough attempt by patient Pharyngeal- Thin Straw -- Pharyngeal -- Pharyngeal- Puree Delayed swallow initiation-pyriform sinuses;Reduced pharyngeal peristalsis;Reduced airway/laryngeal closure;Reduced tongue base retraction;Penetration/Apiration after swallow;Trace aspiration;Pharyngeal residue - valleculae;Pharyngeal residue - pyriform Pharyngeal Material enters airway, passes BELOW cords and not ejected out despite cough attempt by patient Pharyngeal- Mechanical Soft -- Pharyngeal -- Pharyngeal- Regular -- Pharyngeal -- Pharyngeal- Multi-consistency -- Pharyngeal -- Pharyngeal- Pill -- Pharyngeal -- Pharyngeal Comment --  No flowsheet data found. CHL IP GO 06/13/2015 Functional Assessment Tool Used skilled clinical judgment Functional Limitations Swallowing Swallow Current Status [id number]) CI Swallow Goal Status [id number]) CI Swallow Discharge Status [id number]) CI Motor Speech Current Status [id number]) (None) Motor Speech Goal Status UK:060616) (None) Motor Speech Goal Status SA:931536) (None)  Spoken Language Comprehension Current Status [phone]) (None) Spoken Language Comprehension Goal Status [phone]) (None) Spoken Language Comprehension Discharge Status [phone]) (None) Spoken Language Expression Current Status [phone]) (None) Spoken Language Expression Goal Status [phone]) (None) Spoken Language Expression Discharge Status [phone]) (None) Attention Current Status LV:671222) (None) Attention Goal Status FV:388293) (None) Attention Discharge Status [phone]) (None) Memory Current Status AE:130515) (None) Memory Goal Status GI:463060) (None) Memory Discharge Status  UZ:5226335) (None) Voice Current Status PO:3169984) (None) Voice Goal Status SQ:4094147) (None) Voice Discharge Status [id number]) (None) Other Speech-Language Pathology Functional Limitation [phone]) (None) Other Speech-Language Pathology Functional Limitation Goal Status [id number]) (None) Other Speech-Language Pathology Functional Limitation Discharge Status [phone]) (None) Assunta Curtis 04/27/2016, 2:32 PM               Chenae Brager, DO  [facility] Pager [phone]  If 7PM-7AM, please contact night-coverage [email or web address] Password [email or web address] 04/30/2016, 12:23 PM   LOS: 6 days

## 2016-04-30 NOTE — Progress Notes (Addendum)
LCSWA contacted DSS guardian concerning DNR paper work status. [place] working to complete DNR paperwork and obtaining MD signature.   Plan: Patient plan is pending at this time. SNF vs Hospice Home. Completed DNR paperwork and faxed to facility. Will change code status and complete GOLD DNR form once DSS has confirmed they have reviewed and received paperwork.  Dr. Domingo Cocking is working on a letter to complete for recommendation: [facility]idential [facility]. LCSW updated SNF Facility regarding possibility that pt may not return to SNF. Agreeable, but report he is welcome to return with hospice.  Will continue to follow   Kathrin Greathouse, Latanya Presser, MSW Clinical Social Worker 5E and Psychiatric Service Line [phone] 04/30/2016  11:43 AM

## 2016-04-30 NOTE — Consult Note (Signed)
Consultation Note Date: 04/30/2016   Patient Name: Jeffery Rhodes  DOB: [birth year]  MRN: [id number]  Age / Sex: 82 y.o., male  PCP: Gildardo Cranker, DO Referring Physician: Orson Eva, MD  Reason for Consultation: Establishing goals of care  HPI/Patient Profile: 82 y.o. male  with past medical history of Hepatocellular cancer, hepatitis C, hypertension, diabetes, dementia admitted on 04/24/2016 with sepsis, bacteremia, aspiration pneumonia, UTI, portal vein thrombosis, hepatoma/colitis, and thrombocytopenia.   I met with [name] today but he remained largely somnolent and did not answer any questions throughout encounter. He did open his eyes to command but did not answer any questions for me.  I called and discussed with [name] with [place] who is his appointed guardian.  She has a good understanding of his clinical situation and the fact that he continues to decline despite medical therapy. Talked about pathways moving forward as well as the fact that he is approaching the end of his life regardless of medical interventions pursued from this point forward.  Clinical Assessment and Goals of Care: [name] is an 82 year old gentleman with above-noted problems with continued decline despite maximum medical therapy. On my examination today, it appears to me that he is transitioning to early phase of actively dying. I called and expressed this to his legal guardian and she will work in conjunction with her colleagues to determine best plan of care moving forward.  LEGAL GUARDIAN through [employer] Department of Social Services: [name]    SUMMARY OF RECOMMENDATIONS   - I believe that Mr. [name] will continue to decline and die in the next several hours to days regardless of further interventions. My recommendation is to focus on his comfort and dignity in the last period  of his life as it appears to me that he is in beginning phases of actively dying. I believe he would best be served by transition to residential hospice for end-of-life care. I discussed this with his primary attending, his guardian through [place], and Education officer, museum. I completed a letter with this recommendation and faxed it to [name] for their review. If guardian is in agreement with plan for comfort care and residential hospice placement, I would recommend referral to individual hospice for end-of-life care.   Code Status/Advance Care Planning:  Full code: I discussed this with [name] as well as patient's guardian through [place] of social services. I completed paperwork with recommendation to transition to DO NOT RESUSCITATE. [name] was also in agreement that this be the most appropriate recommendation morning forward with patient's care. Paperwork was completed with assistance of social work to fax to [name] of social services for review with recommendation for changing CODE STATUS to DO NOT RESUSCITATE.    Symptom Management:   Pain/shortness of breath: Continue Dilaudid as needed  Palliative Prophylaxis:   Aspiration and Frequent Pain Assessment  Additional Recommendations (Limitations, Scope, Preferences):  Full Comfort Care was recommendation and I gave  to his guardian with [place] Department of Social Services  Psycho-social/Spiritual:   Desire for further Chaplaincy support:no  Additional Recommendations: Education on Hospice  Prognosis:   Hours - Days.  His overall condition continues to decline despite appropriate medical therapy. He is not taking any nutrition or hydration by mouth. His white count continues to decline. He is becoming more somnolent and less responsive. I believe he is transitioning to early phase of actively dying.  Discharge Planning: To Be Determined.  My  recommendation to his guardian was transitioning to comfort measures only and pursuing placement at residential hospice facility for end-of-life care.  I completed a letter with this recommendation and faxed it to her for her to review.      Primary Diagnoses: Present on Admission:  . HCAP (healthcare-associated pneumonia) . Sepsis ([name]) . Type II diabetes mellitus with manifestations ([name]) . Essential hypertension, benign . Hepatocellular carcinoma ([name]) . Hepatitis C . Dementia without behavioral disturbance . Leukocytosis . Thrombocytopenia ([place])  I have reviewed the medical record, interviewed the patient and family, and examined the patient. The following aspects are pertinent.  Past Medical History  Diagnosis Date  . Dementia   . Cancer ([place])   . Liver cancer ([name])   . Hepatitis C    Social History   Social History  . Marital Status: Divorced    Spouse Name: N/A  . Number of Children: N/A  . Years of Education: N/A   Social History Main Topics  . Smoking status: Never Smoker   . Smokeless tobacco: None  . Alcohol Use: No  . Drug Use: None  . Sexual Activity: Not Asked   Other Topics Concern  . None   Social History Narrative   History reviewed. No pertinent family history. Scheduled Meds: . ceFEPime (MAXIPIME) IV  1 g Intravenous Q12H  . heparin lock flush  500 Units Intracatheter Q30 days  . metronidazole  500 mg Intravenous Q8H   Continuous Infusions: . dextrose 50 mL/hr at 04/30/16 1240  . heparin 1,000 Units/hr (04/30/16 1244)  .  sodium bicarbonate 150 mEq in sterile water 1000 mL infusion 75 mL/hr at 04/29/16 2004   PRN Meds:.acetaminophen **OR** acetaminophen, acetone (urine) test, albuterol, diatrizoate meglumine-sodium, heparin lock flush **AND** heparin lock flush, HYDROmorphone (DILAUDID) injection, ondansetron **OR** ondansetron (ZOFRAN) IV, sodium chloride flush Medications Prior to Admission:  Prior to Admission medications   Medication  Sig Start Date End Date Taking? Authorizing Provider  amLODipine (NORVASC) 5 MG tablet Take 10 mg by mouth daily.    Yes Historical Provider, MD  clopidogrel (PLAVIX) 75 MG tablet Take 75 mg by mouth daily. 12/11/14  Yes Historical Provider, MD  donepezil (ARICEPT) 10 MG tablet Take 10 mg by mouth at bedtime.   Yes Historical Provider, MD  gabapentin (NEURONTIN) 300 MG capsule Take 300 mg by mouth 2 (two) times daily.  11/14/14  Yes Historical Provider, MD  oxyCODONE (ROXICODONE) 5 MG immediate release tablet Take one tablet by mouth every 4 hours as needed for pain 08/19/15  Yes Tiffany L Reed, DO  piperacillin-tazobactam (ZOSYN) 3.375 (3-0.375) g injection Inject 3.375 g into the muscle every 6 (six) hours.   Yes Historical Provider, MD  polyethylene glycol powder (GLYCOLAX/MIRALAX) powder Take 17 g by mouth daily.  11/14/14  Yes Historical Provider, MD  Potassium Chloride ER 20 MEQ TBCR Take 20 mEq by mouth daily.   Yes Historical Provider, MD  sertraline (ZOLOFT) 50 MG tablet Take 75 mg by mouth daily.  Yes Historical Provider, MD  vancomycin 500 mg in sodium chloride 0.9 % 100 mL Inject 500 mg into the vein every 8 (eight) hours.   Yes Historical Provider, MD   No Known Allergies Review of Systems  Unable to obtain  Physical Exam  General: Sleeping but arouses to verbal or tactile stimulation, nonverbal and will not answer questions, in no acute distress.  HEENT: No bruits, no goiter, no JVD Heart: Regular rate and rhythm. No murmur appreciated. Lungs: Else throughout, fair air movement Abdomen: Soft, nontender, nondistended, positive bowel sounds.  Ext: 2+ edema Skin: Warm and dry Neuro: Will not follow commands to assess   Vital Signs: BP 103/57 mmHg  Pulse 91  Temp(Src) 97.6 F (36.4 C) (Axillary)  Resp 18  Ht _0  (1.702 m)  Wt 60.1 kg (132 lb 7.9 oz)  BMI 20.75 kg/m2  SpO2 96% Pain Assessment: No/denies pain   Pain Score: 0-No pain   SpO2: SpO2: 96 % O2  Device:SpO2: 96 % O2 Flow Rate: .   IO: Intake/output summary:  Intake/Output Summary (Last 24 hours) at 04/30/16 1400 Last data filed at 04/30/16 0600  Gross per 24 hour  Intake 2555.5 ml  Output    300 ml  Net 2255.5 ml    LBM: Last BM Date: 04/28/16 Baseline Weight: Weight: 60.1 kg (132 lb 7.9 oz) Most recent weight: Weight: 60.1 kg (132 lb 7.9 oz)     Palliative Assessment/Data: PPS 10%   Flowsheet Rows        Most Recent Value   Intake Tab    Referral Department  Hospitalist   Unit at Time of Referral  Med/Surg Unit   Palliative Care Primary Diagnosis  Cancer   Date Notified  04/29/16   Palliative Care Type  Return patient Palliative Care   Reason for referral  Clarify Goals of Care   Date of Admission  04/24/16   Date first seen by Palliative Care  04/30/16   # of days Palliative referral response time  1 Day(s)   # of days IP prior to Palliative referral  5   Clinical Assessment    Palliative Performance Scale Score  20%   Pain Max last 24 hours  Not able to report   Pain Min Last 24 hours  Not able to report   Psychosocial & Spiritual Assessment    Palliative Care Outcomes    Patient/Family meeting held?  Yes   Who was at the meeting?  Guardian through [place]      Time In: 1215 Time Out: 1330 Time Total: 75 Greater than 50%  of this time was spent counseling and coordinating care related to the above assessment and plan.  Signed by: Micheline Rough, MD   Please contact Palliative Medicine Team phone at [phone] for questions and concerns.  For individual provider: See Shea Evans

## 2016-04-30 NOTE — Progress Notes (Signed)
CRITICAL VALUE ALERT  Critical value received:  Blood culture gram negative rods  Date of notification:  04-30-16  Time of notification:  1230  Critical value read back: yes  Nurse who received alert:  Henrietta Dine  MD notified (1st page):  [name]  Time of first page:  1230  MD notified (2nd page):  Time of second page:  Responding MD:  [name]  Time MD responded:  1230

## 2016-04-30 NOTE — Progress Notes (Signed)
ANTICOAGULATION CONSULT NOTE - Initial Consult  Pharmacy Consult for Heparin Indication:  Suspected portal vein thrombosis  No Known Allergies  Patient Measurements: Height: 5\' 7"  (170.2 cm) Weight: 132 lb 7.9 oz (60.1 kg) IBW/kg (Calculated) : 66.1  Vital Signs: Temp: 97.6 F (36.4 C) (05/11 0517) Temp Source: Axillary (05/11 0517) BP: 103/57 mmHg (05/11 0930) Pulse Rate: 91 (05/11 0517)  Labs:  Recent Labs  04/28/16 0655 04/29/16 0545 04/29/16 2357 04/30/16 0400 04/30/16 1015  HGB 9.7* 9.5*  --  9.0*  --   HCT 26.2* 25.9*  --  24.4*  --   PLT 69* 74*  --  68*  --   HEPARINUNFRC  --   --  0.32  --  0.40  CREATININE 1.02 1.05  --  0.96  --     Estimated Creatinine Clearance: 48.7 mL/min (by C-G formula based on Cr of 0.96).   Medical History: Past Medical History  Diagnosis Date  . Dementia   . Cancer ([place])   . Liver cancer ([place])   . Hepatitis C     Medications:  Infusions:  . dextrose 50 mL/hr at 04/30/16 1240  . heparin 1,000 Units/hr (04/30/16 1244)  .  sodium bicarbonate 150 mEq in sterile water 1000 mL infusion 75 mL/hr at 04/29/16 2004    Assessment: 82 yo M admitted 04/24/16 with hx hepatocellular CA and Ecoli sepsis.  Known to pharmacy from antibiotic dosing.  He now has + portal vein thrombosis and pharmacy consulted to start heparin.    Today, 04/30/2016: - confirmatory heparin level came back therapeutic at 0.40 - hgb and plts trending down slightly to 9 and 68, respectively - patient got thoracentesis this morning (5/11), IR kept heparin drip running during procedure.  Small amount of blood was noted at procedure site when patient came back to the floor.  Will monitor closely for now.   Goal of Therapy:  Heparin level 0.3-0.7 units/ml Monitor platelets by anticoagulation protocol: Yes   Plan:  - continue heparin drip at 1000 units/hr - monitor for s/s bleeding - daily heparin level - with palliative care recommending comfort/hospice  care--> please indicate plan for anticoagulation  Dia Sitter, PharmD, BCPS 04/30/2016 2:56 PM

## 2016-04-30 NOTE — Procedures (Signed)
Successful US guided left thoracentesis. Yielded 458mL of hazy yellow fluid. Pt tolerated procedure well. No immediate complications.  Specimen was sent for labs. CXR ordered.  Ascencion Dike PA-C 04/30/2016 9:38 AM

## 2016-05-01 ENCOUNTER — Inpatient Hospital Stay (HOSPITAL_COMMUNITY): Payer: Medicare Other

## 2016-05-01 DIAGNOSIS — R7881 Bacteremia: Secondary | ICD-10-CM

## 2016-05-01 LAB — BASIC METABOLIC PANEL
Anion gap: 10 (ref 5–15)
BUN: 37 mg/dL — AB (ref 6–20)
CALCIUM: 7.3 mg/dL — AB (ref 8.9–10.3)
CO2: 15 mmol/L — ABNORMAL LOW (ref 22–32)
CREATININE: 1.06 mg/dL (ref 0.61–1.24)
Chloride: 122 mmol/L — ABNORMAL HIGH (ref 101–111)
Glucose, Bld: 110 mg/dL — ABNORMAL HIGH (ref 65–99)
Potassium: 3.7 mmol/L (ref 3.5–5.1)
SODIUM: 147 mmol/L — AB (ref 135–145)

## 2016-05-01 LAB — ECHOCARDIOGRAM COMPLETE
HEIGHTINCHES: 67 in
WEIGHTICAEL: 2119.94 [oz_av]

## 2016-05-01 LAB — CBC
HCT: 24.4 % — ABNORMAL LOW (ref 39.0–52.0)
HEMOGLOBIN: 9 g/dL — AB (ref 13.0–17.0)
MCH: 31.4 pg (ref 26.0–34.0)
MCHC: 36.9 g/dL — AB (ref 30.0–36.0)
MCV: 85 fL (ref 78.0–100.0)
PLATELETS: 72 10*3/uL — AB (ref 150–400)
RBC: 2.87 MIL/uL — ABNORMAL LOW (ref 4.22–5.81)
RDW: 15.5 % (ref 11.5–15.5)
WBC: 35.8 10*3/uL — AB (ref 4.0–10.5)

## 2016-05-01 LAB — GLUCOSE, CAPILLARY
GLUCOSE-CAPILLARY: 74 mg/dL (ref 65–99)
Glucose-Capillary: 97 mg/dL (ref 65–99)
Glucose-Capillary: 82 mg/dL (ref 65–99)
Glucose-Capillary: 79 mg/dL (ref 65–99)

## 2016-05-01 MED ORDER — LORAZEPAM 1 MG PO TABS: 1.0000 mg | ORAL_TABLET | ORAL | Status: DC | PRN

## 2016-05-01 MED ORDER — HALOPERIDOL 0.5 MG PO TABS
0.5000 mg | ORAL_TABLET | ORAL | Status: DC | PRN
  Filled 2016-05-01: qty 1

## 2016-05-01 MED ORDER — LORAZEPAM 2 MG/ML IJ SOLN
1.0000 mg | INTRAMUSCULAR | Status: DC | PRN
Start: 2016-05-01 — End: 2016-05-02

## 2016-05-01 MED ORDER — GLYCOPYRROLATE 1 MG PO TABS
1.0000 mg | ORAL_TABLET | ORAL | Status: DC | PRN
  Filled 2016-05-01: qty 1

## 2016-05-01 MED ORDER — HYDROMORPHONE HCL 1 MG/ML IJ SOLN: 0.5000 mg | INTRAMUSCULAR | Status: DC | PRN

## 2016-05-01 MED ORDER — BISACODYL 10 MG RE SUPP
10.0000 mg | Freq: Every day | RECTAL | Status: DC | PRN
Start: 2016-05-01 — End: 2016-05-02

## 2016-05-01 MED ORDER — GLYCOPYRROLATE 0.2 MG/ML IJ SOLN
0.2000 mg | INTRAMUSCULAR | Status: DC | PRN
  Filled 2016-05-01: qty 1

## 2016-05-01 MED ORDER — LORAZEPAM 2 MG/ML PO CONC: 1.0000 mg | ORAL | Status: DC | PRN

## 2016-05-01 MED ORDER — HALOPERIDOL LACTATE 5 MG/ML IJ SOLN: 0.5000 mg | INTRAMUSCULAR | Status: DC | PRN

## 2016-05-01 MED ORDER — HALOPERIDOL LACTATE 2 MG/ML PO CONC
0.5000 mg | ORAL | Status: DC | PRN
  Filled 2016-05-01: qty 0.3

## 2016-05-01 NOTE — Progress Notes (Signed)
CSW assisting with d/c planning. PN reviewed. Message left for pt's DSS guardain to contact CSW for assistance with d/c planning. Palliative Care Team has recommended [facility].   Werner Lean LCSW [phone]

## 2016-05-01 NOTE — Progress Notes (Signed)
DSS guardian reports that DNR has been approved. She will bring documentation to [facility] this am. CSW has contacted [facility] to check availability and provide referral. Awaiting call back.   Werner Lean LCSW [phone]

## 2016-05-01 NOTE — Progress Notes (Signed)
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 risk, not inconsistent with his advanced dementia.  Recommend consideration of GOC and whether comfort POs are warranted. (Given pt is ward of the state and H. Coble's note re: such, will not engage in [place] education re: MBS). SLP will follow for plan.  Impact on safety and function Severe aspiration risk   CHL IP TREATMENT RECOMMENDATION 04/27/2016 Treatment Recommendations (No Data)   Prognosis 04/27/2016 Prognosis for Safe Diet Advancement Guarded Barriers to Reach Goals Cognitive deficits Barriers/Prognosis Comment -- CHL IP DIET RECOMMENDATION 04/27/2016 SLP Diet Recommendations NPO Liquid Administration via -- Medication Administration -- Compensations -- Postural Changes --   CHL IP OTHER RECOMMENDATIONS 04/27/2016 Recommended Consults -- Oral Care Recommendations Oral care QID Other Recommendations --   CHL IP FOLLOW UP  RECOMMENDATIONS 04/27/2016 Follow up Recommendations (No Data)   CHL IP FREQUENCY AND DURATION 04/27/2016 Speech Therapy Frequency (ACUTE ONLY) min 1 x/week Treatment Duration 1 week  CHL IP ORAL PHASE 04/27/2016 Oral Phase Impaired Oral - Pudding Teaspoon -- Oral - Pudding Cup -- Oral - Honey Teaspoon -- Oral - Honey Cup -- Oral - Nectar Teaspoon -- Oral - Nectar Cup Weak lingual manipulation;Incomplete tongue to palate contact;Holding of bolus;Reduced posterior propulsion;Delayed oral transit;Decreased bolus cohesion Oral - Nectar Straw -- Oral - Thin Teaspoon -- Oral - Thin Cup Weak lingual manipulation;Incomplete tongue to palate contact;Holding of bolus;Reduced posterior propulsion;Delayed oral transit;Decreased bolus cohesion Oral - Thin Straw -- Oral - Puree Weak lingual manipulation;Incomplete tongue to palate contact;Holding of bolus;Reduced posterior propulsion;Delayed oral transit;Decreased bolus cohesion Oral - Mech Soft -- Oral - Regular -- Oral - Multi-Consistency -- Oral - Pill -- Oral Phase - Comment --  CHL IP PHARYNGEAL PHASE 04/27/2016 Pharyngeal Phase Impaired Pharyngeal- Pudding Teaspoon -- Pharyngeal -- Pharyngeal- Pudding Cup -- Pharyngeal -- Pharyngeal- Honey Teaspoon -- Pharyngeal -- Pharyngeal- Honey Cup -- Pharyngeal -- Pharyngeal- Nectar Teaspoon -- Pharyngeal -- Pharyngeal- Nectar Cup Delayed swallow initiation-pyriform sinuses;Reduced pharyngeal peristalsis;Reduced airway/laryngeal closure;Reduced tongue base retraction;Penetration/Aspiration during swallow;Penetration/Apiration after swallow;Trace aspiration;Pharyngeal residue - valleculae;Pharyngeal residue - pyriform Pharyngeal Material enters airway, passes BELOW cords and not ejected out despite cough attempt by patient Pharyngeal- Nectar Straw -- Pharyngeal -- Pharyngeal- Thin Teaspoon -- Pharyngeal -- Pharyngeal- Thin Cup Delayed swallow initiation-pyriform sinuses;Reduced pharyngeal peristalsis;Reduced airway/laryngeal  closure;Reduced tongue base retraction;Penetration/Aspiration during swallow;Penetration/Apiration after swallow;Trace aspiration;Pharyngeal residue - valleculae;Pharyngeal residue - pyriform Pharyngeal Material enters airway, passes BELOW cords and not ejected out despite cough attempt by patient Pharyngeal- Thin Straw -- Pharyngeal -- Pharyngeal- Puree Delayed swallow initiation-pyriform sinuses;Reduced pharyngeal peristalsis;Reduced airway/laryngeal closure;Reduced tongue base retraction;Penetration/Apiration after swallow;Trace aspiration;Pharyngeal residue - valleculae;Pharyngeal residue - pyriform Pharyngeal Material enters airway, passes BELOW cords and not ejected out despite cough attempt by patient Pharyngeal- Mechanical Soft -- Pharyngeal -- Pharyngeal- Regular -- Pharyngeal -- Pharyngeal- Multi-consistency -- Pharyngeal -- Pharyngeal- Pill -- Pharyngeal -- Pharyngeal Comment --  No flowsheet data found. CHL IP GO 06/13/2015 Functional Assessment Tool Used skilled clinical judgment Functional Limitations Swallowing Swallow Current Status [id number]) CI Swallow Goal Status [id number]) CI Swallow Discharge Status [id number]) CI Motor Speech Current Status [id number]:1826400) (None) Motor Speech Goal Status UK:060616) (None) Motor Speech Goal Status SA:931536) (None) Spoken Language Comprehension Current Status MZ:5018135) (None) Spoken Language Comprehension Goal Status YD:1972797) (None) Spoken Language Comprehension Discharge Status [phone]) (None) Spoken Language Expression Current Status [phone]) (None) Spoken Language Expression Goal Status [id number]:9098795) (None) Spoken Language Expression Discharge Status [phone]) (None) Attention Current Status OM:1732502) (None) Attention Goal Status EY:7266000) (None) Attention Discharge Status [phone]) (None) Memory Current Status YL:3545582) (None) Memory Goal Status CF:3682075) (None) Memory Discharge Status QC:115444) (None) Voice Current Status [id number]:6183357) (None) Voice Goal Status [id number]:8517110) (None) Voice Discharge Status JH:9561856) (None)  Other Speech-Language Pathology Functional Limitation [phone]) (None) Other Speech-Language Pathology Functional Limitation Goal Status XD:1448828) (None) Other Speech-Language Pathology Functional Limitation Discharge Status [phone]) (None) Juan Quam Laurice 04/27/2016, 2:32 PM              US Thoracentesis Asp Pleural Space W/img Guide  04/30/2016  INDICATION: Pneumonia. Shortness of breath. Left-sided pleural effusion. Request diagnostic and therapeutic thoracentesis. EXAM: ULTRASOUND GUIDED LEFT THORACENTESIS MEDICATIONS: None. COMPLICATIONS: None immediate. Postprocedural chest x-ray negative for pneumothorax. PROCEDURE: An ultrasound guided thoracentesis was thoroughly discussed with the patient and questions answered. The benefits, risks, alternatives and complications were also discussed. The patient understands and wishes to proceed with the procedure. Written consent was obtained. Ultrasound was performed to localize and mark an adequate pocket of fluid  in the left chest. The area was then prepped and draped in the normal sterile fashion. 1% Lidocaine was used for local anesthesia. Under ultrasound guidance a Safe-T-Centesis catheter was introduced. Thoracentesis was performed. The catheter was removed and a dressing applied. FINDINGS: A total of approximately 400 mL of hazy, yellow colored fluid was removed. Samples were sent to the laboratory as requested by the clinical team. IMPRESSION: Successful ultrasound guided left thoracentesis yielding 400 mL of pleural fluid. Read by: Ascencion Dike PA-C Electronically Signed   By: Markus Daft M.D.   On: 04/30/2016 16:15    Rowan Blaker, DO  [facility] Pager [phone]  If 7PM-7AM, please contact night-coverage [email or web address] Password [email or web address] 05/01/2016, 6:45 PM   LOS: 7 days

## 2016-05-01 NOTE — Progress Notes (Signed)
CSW met with DSS guardian. Documentation of DNR approval is in chart. DNR signed by MD. [facility]osp[facility] at [facility] has accepted pt. Bed is available tomorrow and hospice home would like pt by 10 am. DSS guardian is aware and in agreement with this plan. CSW will assist with d/c planning to hospice home in the am.  Werner Lean LCSW [phone]

## 2016-05-01 NOTE — Care Management Important Message (Signed)
Important Message  Patient Details  Name: Jeffery Rhodes MRN: [id number] Date of Birth: [birth year]   Medicare Important Message Given:  Yes    Camillo Flaming 05/01/2016, 10:48 AMImportant Message  Patient Details  Name: Jeffery Rhodes MRN: [id number] Date of Birth: [birth year]   Medicare Important Message Given:  Yes    Camillo Flaming 05/01/2016, 10:48 AM

## 2016-05-01 NOTE — Progress Notes (Signed)
Spoke with DSS guardian. She has submitted request for DNR to her supervisors and a decision is pending. If DNR is approved, [place] would like [facility] or [facility]. Expect to hear back from guardian today.  Werner Lean LCSW [phone]

## 2016-05-01 NOTE — Progress Notes (Signed)
Daily Progress Note   Patient Name: Jeffery Rhodes       Date: 05/01/2016 DOB: [birth year]  Age: 82 y.o. MRN#: [id number] Attending Physician: Orson Eva, MD Primary Care Physician: Gildardo Cranker, DO Admit Date: 04/24/2016  Reason for Consultation/Follow-up: Establishing goals of care  Subjective: I saw and examined Jeffery Rhodes today. He is minimally interactive. He opens his eyes to tactile stimulation but he is nonverbal throughout encounter.  I called and spoke with Jeffery Rhodes from [place] who is his guardian. His case has been reviewed and there is agreement with recommendation for comfort care moving forward.  We discussed changes in his care plan including discontinuation of interventions not relating to his comfort, such as antibiotics and IV fluids, and addition of medications as needed for comfort. I confirmed with her plan to change CODE STATUS to DO NOT RESUSCITATE and focus strictly on comfort care moving forward.  He is being evaluated for transition to residential hospice. Hospice home in [facility] noted to be first choice.  Length of Stay: 7  Current Medications: Scheduled Meds:     Continuous Infusions:    PRN Meds: acetaminophen **OR** acetaminophen, albuterol, bisacodyl, glycopyrrolate **OR** glycopyrrolate **OR** glycopyrrolate, haloperidol **OR** haloperidol **OR** haloperidol lactate, HYDROmorphone (DILAUDID) injection, LORazepam **OR** LORazepam **OR** LORazepam, ondansetron **OR** ondansetron (ZOFRAN) IV  Physical Exam      General: Largely somnolent but will open eyes. Nonverbal. in no acute distress.  HEENT: No bruits, no goiter, no JVD Heart: Regular rate and rhythm. No murmur appreciated. Lungs: Fair air movement, coarse throughout Abdomen: Soft, nontender,  nondistended, positive bowel sounds.  Ext: 2+ edema Skin: Warm and dry Neuro: Does not follow commands  Vital Signs: BP 120/64 mmHg  Pulse 94  Temp(Src) 97.6 F (36.4 C) (Axillary)  Resp 16  Ht 5\' 7"  (1.702 m)  Wt 60.1 kg (132 lb 7.9 oz)  BMI 20.75 kg/m2  SpO2 92% SpO2: SpO2: 92 % O2 Device: O2 Device: Not Delivered O2 Flow Rate:    Intake/output summary:  Intake/Output Summary (Last 24 hours) at 05/01/16 1537 Last data filed at 05/01/16 [id number]  Gross per 24 hour  Intake 2997.5 ml  Output    601 ml  Net 2396.5 ml   LBM: Last BM Date: 05/01/16 Baseline Weight: Weight: 60.1 kg (132 lb 7.9 oz) Most  recent weight: Weight: 60.1 kg (132 lb 7.9 oz)       Palliative Assessment/Data: PPS 10%   Flowsheet Rows        Most Recent Value   Intake Tab    Referral Department  Hospitalist   Unit at Time of Referral  Med/Surg Unit   Palliative Care Primary Diagnosis  Cancer   Date Notified  04/29/16   Palliative Care Type  Return patient Palliative Care   Reason for referral  Clarify Goals of Care   Date of Admission  04/24/16   Date first seen by Palliative Care  04/30/16   # of days Palliative referral response time  1 Day(s)   # of days IP prior to Palliative referral  5   Clinical Assessment    Palliative Performance Scale Score  20%   Pain Max last 24 hours  Not able to report   Pain Min Last 24 hours  Not able to report   Psychosocial & Spiritual Assessment    Palliative Care Outcomes    Patient/Family meeting held?  Yes   Who was at the meeting?  Guardian through [place]      Patient Active Problem List   Diagnosis Date Noted  . Healthcare-associated pneumonia   . Escherichia coli sepsis ([place]) 04/29/2016  . Aspiration into airway   . Pleural effusion on left   . Pressure ulcer 04/25/2016  . HCAP (healthcare-associated pneumonia) 04/24/2016  . Sepsis ([place]) 04/24/2016  . Leukocytosis 04/24/2016  . Thrombocytopenia ([place])  04/24/2016  . Depression 12/20/2015  . Essential hypertension, benign 08/26/2015  . Chronic pain 08/26/2015  . Chronic constipation 08/26/2015  . Type II diabetes mellitus with manifestations ([place]) 08/26/2015  . Dementia without behavioral disturbance 06/18/2015  . Hepatitis C 06/18/2015  . Hepatocellular carcinoma ([place]) 06/13/2015  . Right arm pain 06/13/2015  . Palliative care encounter 06/13/2015  . Weakness 06/12/2015  . Failure to thrive in adult 06/12/2015    Palliative Care Assessment & Plan   Recommendations/Plan:  He is approaching end-of-life. Plan moving forward is for full comfort care. Pursuing placement at a residential hospice facility for end-of-life care.  Pain/shortness of breath: Dilaudid as needed  Anxiety: Ativan as needed  Agitation: Haldol as needed  Secretions: Robinul as needed  Goals of Care and Additional Recommendations:  Limitations on Scope of Treatment: Full Comfort Care  Code Status:    Code Status Orders        Start     Ordered   05/01/16 1400  Do not attempt resuscitation (DNR)   Continuous    Question Answer Comment  In the event of cardiac or respiratory ARREST Do not call a "code blue"   In the event of cardiac or respiratory ARREST Do not perform Intubation, CPR, defibrillation or ACLS   In the event of cardiac or respiratory ARREST Use medication by any route, position, wound care, and other measures to relive pain and suffering. May use oxygen, suction and manual treatment of airway obstruction as needed for comfort.      05/01/16 1400    Code Status History    Date Active Date Inactive Code Status Order ID Comments User Context   04/24/2016 11:06 PM 05/01/2016  2:00 PM Full Code [id number]  Theressa Millard, MD Inpatient   06/12/2015 10:42 PM 06/13/2015  7:01 PM Full Code [id number]  Lavina Hamman, MD Inpatient       Prognosis:   Hours - Days  Discharge Planning:  Hospice facility  Care plan was discussed with Dr.  Dwaine Deter from DSS  Thank you for allowing the Palliative Medicine Team to assist in the care of this patient.   Time In: 1320 Time Out: 1400 Total Time 40 Prolonged Time Billed No      Greater than 50%  of this time was spent counseling and coordinating care related to the above assessment and plan.  Micheline Rough, MD  Please contact Palliative Medicine Team phone at [phone] for questions and concerns.

## 2016-05-01 NOTE — Progress Notes (Signed)
  Echocardiogram 2D Echocardiogram has been performed.  Jeffery Rhodes 05/01/2016, 9:26 AM

## 2016-05-02 DIAGNOSIS — J690 Pneumonitis due to inhalation of food and vomit: Secondary | ICD-10-CM

## 2016-05-02 LAB — CULTURE, BLOOD (ROUTINE X 2)

## 2016-05-02 MED ORDER — GLYCOPYRROLATE 0.2 MG/ML IJ SOLN: 0.2000 mg | INTRAMUSCULAR | Status: AC | PRN

## 2016-05-02 MED ORDER — HALOPERIDOL LACTATE 2 MG/ML PO CONC: 1.0000 mg | ORAL | Status: AC | PRN

## 2016-05-02 MED ORDER — LORAZEPAM 2 MG/ML PO CONC: 1.0000 mg | ORAL | Status: AC | PRN

## 2016-05-02 MED ORDER — MORPHINE SULFATE (CONCENTRATE) 10 MG /0.5 ML PO SOLN: 4.0000 mg | ORAL | Status: AC | PRN

## 2016-05-02 NOTE — Progress Notes (Signed)
Patient for d/c today to [facility] in [place] for residential hospice/EOL care.  DSS Guardian aware. RN to call report-  plan transfer via EMS. Eduard Clos, MSW, LCSW [phone]

## 2016-05-02 NOTE — Progress Notes (Signed)
Report called to Darrick Penna, Therapist, sports at [facility]. Patient alert with eyes open but does not answer questions. Appears to be resting in no distress or discomfort. VSS. Patient dressed and ready to go at this time. Awaiting EMS transport.

## 2016-05-02 NOTE — Discharge Summary (Signed)
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 discussed. The patient understands and wishes to proceed with the procedure. Written consent was obtained. Ultrasound was performed to localize and mark an adequate pocket of fluid in the left chest. The area was then prepped and draped in the normal sterile fashion. 1% Lidocaine was used for local anesthesia. Under ultrasound guidance a Safe-T-Centesis catheter was introduced. Thoracentesis was performed. The catheter was removed and a dressing applied. FINDINGS: A total of approximately 400 mL of hazy, yellow colored fluid was removed. Samples were sent to the laboratory as requested by the clinical team. IMPRESSION: Successful ultrasound guided left thoracentesis yielding 400 mL of pleural fluid. Read by: Ascencion Dike PA-C Electronically Signed   By: Markus Daft M.D.   On: 04/30/2016 16:15     Microbiology: Recent Results (from the past 240 hour(s))  Culture, blood (routine x 2)     Status: None   Collection Time: 04/24/16  6:51 PM  Result Value Ref Range Status   Specimen Description BLOOD RIGHT HAND  Final   Special Requests BOTTLES DRAWN AEROBIC ONLY 6 CC  Final   Culture   Final    NO GROWTH 5 DAYS Performed at [facility]    Report Status 04/29/2016 FINAL  Final    Culture, blood (routine x 2)     Status: Abnormal   Collection Time: 04/24/16  7:56 PM  Result Value Ref Range Status   Specimen Description BLOOD LEFT HAND  Final   Special Requests IN PEDIATRIC BOTTLE 3CC  Final   Culture  Setup Time   Final    GRAM NEGATIVE RODS IN PEDIATRIC BOTTLE Organism ID to follow CRITICAL RESULT CALLED TO, READ BACK BY AND VERIFIED WITH: GADHIA,J PHARMD 04/25/16 1435 WOOTEN,K GRAM STAIN REVIEWED-AGREE WITH RESULT BENFIELD,L Performed at [facility] (A)  Final   Report Status 04/27/2016 FINAL  Final   Organism ID, Bacteria ESCHERICHIA COLI  Final      Susceptibility   Escherichia coli - MIC*    AMPICILLIN >=32 RESISTANT Resistant     CEFAZOLIN <=4 SENSITIVE Sensitive     CEFEPIME <=1 SENSITIVE Sensitive     CEFTAZIDIME <=1 SENSITIVE Sensitive     CEFTRIAXONE <=1 SENSITIVE Sensitive     CIPROFLOXACIN <=0.25 SENSITIVE Sensitive     GENTAMICIN <=1 SENSITIVE Sensitive     IMIPENEM <=0.25 SENSITIVE Sensitive     TRIMETH/SULFA <=20 SENSITIVE Sensitive     AMPICILLIN/SULBACTAM 8 SENSITIVE Sensitive     PIP/TAZO <=4 SENSITIVE Sensitive     * ESCHERICHIA COLI  Blood Culture ID Panel (Reflexed)     Status: Abnormal   Collection Time: 04/24/16  7:56 PM  Result Value Ref Range Status   Enterococcus species NOT DETECTED NOT DETECTED Final   Vancomycin resistance NOT DETECTED NOT DETECTED Final   Listeria monocytogenes NOT DETECTED NOT DETECTED Final   Staphylococcus species NOT DETECTED NOT DETECTED Final   Staphylococcus aureus NOT DETECTED NOT DETECTED Final   Methicillin resistance NOT DETECTED NOT DETECTED Final   Streptococcus species NOT DETECTED NOT DETECTED Final   Streptococcus agalactiae NOT DETECTED NOT DETECTED Final   Streptococcus pneumoniae NOT DETECTED NOT DETECTED Final   Streptococcus pyogenes NOT DETECTED NOT DETECTED Final   Acinetobacter baumannii NOT DETECTED NOT DETECTED Final   Enterobacteriaceae  species NOT DETECTED NOT DETECTED Final   Enterobacter cloacae complex NOT DETECTED NOT DETECTED Final  Escherichia coli DETECTED (A) NOT DETECTED Final    Comment: CRITICAL RESULT CALLED TO, READ BACK BY AND VERIFIED WITH: GADHIA,J PHARMD 04/25/16 1435 WOOTEN,K    Klebsiella oxytoca NOT DETECTED NOT DETECTED Final   Klebsiella pneumoniae NOT DETECTED NOT DETECTED Final   Proteus species NOT DETECTED NOT DETECTED Final   Serratia marcescens NOT DETECTED NOT DETECTED Final   Carbapenem resistance NOT DETECTED NOT DETECTED Final   Haemophilus influenzae NOT DETECTED NOT DETECTED Final   Neisseria meningitidis NOT DETECTED NOT DETECTED Final   Pseudomonas aeruginosa NOT DETECTED NOT DETECTED Final   Candida albicans NOT DETECTED NOT DETECTED Final   Candida glabrata NOT DETECTED NOT DETECTED Final   Candida krusei NOT DETECTED NOT DETECTED Final   Candida parapsilosis NOT DETECTED NOT DETECTED Final   Candida tropicalis NOT DETECTED NOT DETECTED Final    Comment: Performed at [facility]  Urine culture     Status: Abnormal   Collection Time: 04/24/16  8:50 PM  Result Value Ref Range Status   Specimen Description URINE, CATHETERIZED  Final   Special Requests NONE  Final   Culture (A)  Final    3,000 COLONIES/mL INSIGNIFICANT GROWTH Performed at [facility]    Report Status 04/26/2016 FINAL  Final  MRSA PCR Screening     Status: None   Collection Time: 04/25/16  3:03 AM  Result Value Ref Range Status   MRSA by PCR NEGATIVE NEGATIVE Final    Comment:        The GeneXpert MRSA Assay (FDA approved for NASAL specimens only), is one component of a comprehensive MRSA colonization surveillance program. It is not intended to diagnose MRSA infection nor to guide or monitor treatment for MRSA infections.   C difficile quick scan w PCR reflex     Status: None   Collection Time: 04/28/16  7:41 AM  Result Value Ref Range Status   C Diff antigen NEGATIVE NEGATIVE Final    C Diff toxin NEGATIVE NEGATIVE Final   C Diff interpretation Negative for toxigenic C. difficile  Final  Culture, blood (Routine X 2) w Reflex to ID Panel     Status: None (Preliminary result)   Collection Time: 04/29/16  5:25 PM  Result Value Ref Range Status   Specimen Description BLOOD LEFT HAND  Final   Special Requests BOTTLES DRAWN AEROBIC ONLY [place]  Final   Culture   Final    NO GROWTH 2 DAYS Performed at [facility]    Report Status PENDING  Incomplete  Culture, blood (Routine X 2) w Reflex to ID Panel     Status: None (Preliminary result)   Collection Time: 04/29/16  5:30 PM  Result Value Ref Range Status   Specimen Description BLOOD LEFT ARM  Final   Special Requests IN PEDIATRIC BOTTLE [place]  Final   Culture  Setup Time   Final    GRAM NEGATIVE RODS IN PEDIATRIC BOTTLE CRITICAL RESULT CALLED TO, READ BACK BY AND VERIFIED WITH: Charlsie Merles RN 12:30 04/30/16 ([email or web address])    Culture GRAM NEGATIVE RODS  Final   Report Status PENDING  Incomplete  Culture, body fluid-bottle     Status: None (Preliminary result)   Collection Time: 04/30/16  9:42 AM  Result Value Ref Range Status   Specimen Description FLUID PLEURAL  Final   Special Requests NONE  Final   Culture   Final    NO GROWTH 1 DAY Performed at [facility]    Report  Status PENDING  Incomplete  Gram stain     Status: None   Collection Time: 04/30/16  9:42 AM  Result Value Ref Range Status   Specimen Description FLUID PLEURAL  Final   Special Requests NONE Performed at [facility]   Final   Gram Stain   Final    FEW WBC PRESENT,BOTH PMN AND MONONUCLEAR NO ORGANISMS SEEN    Report Status 04/30/2016 FINAL  Final     Labs: Basic Metabolic Panel:  Recent Labs Lab 04/26/16 0755  04/27/16 0501 04/28/16 0655 04/29/16 0545 04/30/16 0400 05/01/16 0521  NA 143  < > 138 138 140 147* 147*  K 2.7*  < > 3.4* 3.5 4.2 4.1 3.7  CL 122*  < > 118* 118* 122* 126* 122*  CO2 13*  < > 12* 13* 12* 12* 15*    GLUCOSE 145*  < > 198* 111* 171* 90 110*  BUN 55*  < > 45* 37* 30* 27* 37*  CREATININE 1.33*  < > 1.13 1.02 1.05 0.96 1.06  CALCIUM 7.7*  < > 7.6* 7.8* 7.5* 7.5* 7.3*  MG 1.6*  --  1.5* 2.3  --   --   --   < > = values in this interval not displayed. Liver Function Tests:  Recent Labs Lab 04/26/16 0755 04/30/16 1300  AST 125* 71*  ALT 42 20  ALKPHOS 239* 269*  BILITOT 1.7* 2.0*  PROT 5.5* 5.5*  ALBUMIN 1.6* 1.4*   No results for input(s): LIPASE, AMYLASE in the last 168 hours. No results for input(s): AMMONIA in the last 168 hours. CBC:  Recent Labs Lab 04/27/16 0501 04/28/16 0655 04/29/16 0545 04/30/16 0400 05/01/16 0521  WBC 31.6* 35.2* 36.8* 43.3* 35.8*  HGB 9.7* 9.7* 9.5* 9.0* 9.0*  HCT 27.0* 26.2* 25.9* 24.4* 24.4*  MCV 86.5 85.1 85.2 85.0 85.0  PLT 95* 69* 74* 68* 72*   Cardiac Enzymes: No results for input(s): CKTOTAL, CKMB, CKMBINDEX, TROPONINI in the last 168 hours. BNP: Invalid input(s): POCBNP CBG:  Recent Labs Lab 04/30/16 2211 05/01/16 0733 05/01/16 1151 05/01/16 1707 05/01/16 2026  GLUCAP 87 97 82 74 79    Time coordinating discharge:  Greater than 30 minutes  Signed:  Indria Bishara, DO [facility] Pager: [phone] , 8:12 AM

## 2016-05-05 LAB — CULTURE, BODY FLUID-BOTTLE

## 2016-05-05 LAB — CULTURE, BLOOD (ROUTINE X 2): Culture: NO GROWTH

## 2016-05-05 LAB — CULTURE, BODY FLUID W GRAM STAIN -BOTTLE: Culture: NO GROWTH

## 2016-05-21 DEATH — deceased
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
## 2018-08-10 ENCOUNTER — Encounter: Payer: Self-pay | Admitting: Internal Medicine
